# Patient Record
Sex: Male | Born: 1990
Health system: Southern US, Community
[De-identification: ages and names within clinical notes are randomized; demographics above are authoritative.]

## PROBLEM LIST (undated history)

## (undated) DIAGNOSIS — Q909 Down syndrome, unspecified: Secondary | ICD-10-CM

## (undated) DIAGNOSIS — F909 Attention-deficit hyperactivity disorder, unspecified type: Secondary | ICD-10-CM

## (undated) DIAGNOSIS — J189 Pneumonia, unspecified organism: Secondary | ICD-10-CM

## (undated) HISTORY — PX: TONSILLECTOMY: SUR1361

## (undated) HISTORY — PX: OTHER SURGICAL HISTORY: SHX169

---
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
## 2012-06-05 ENCOUNTER — Ambulatory Visit: Payer: Medicaid Other | Admitting: Audiology

## 2012-06-10 ENCOUNTER — Ambulatory Visit: Payer: Medicaid Other | Attending: Pediatrics | Admitting: Audiology

## 2012-06-10 DIAGNOSIS — H918X9 Other specified hearing loss, unspecified ear: Secondary | ICD-10-CM | POA: Insufficient documentation

## 2015-02-24 ENCOUNTER — Encounter (HOSPITAL_BASED_OUTPATIENT_CLINIC_OR_DEPARTMENT_OTHER): Payer: Self-pay | Admitting: *Deleted

## 2015-02-24 ENCOUNTER — Emergency Department (HOSPITAL_BASED_OUTPATIENT_CLINIC_OR_DEPARTMENT_OTHER)
Admission: EM | Admit: 2015-02-24 | Discharge: 2015-02-24 | Disposition: A | Discharge status: Home / Self Care | Payer: Medicaid Other | Attending: Emergency Medicine | Admitting: Emergency Medicine

## 2015-02-24 DIAGNOSIS — Q909 Down syndrome, unspecified: Secondary | ICD-10-CM | POA: Insufficient documentation

## 2015-02-24 DIAGNOSIS — L739 Follicular disorder, unspecified: Secondary | ICD-10-CM | POA: Diagnosis not present

## 2015-02-24 DIAGNOSIS — L03115 Cellulitis of right lower limb: Secondary | ICD-10-CM | POA: Insufficient documentation

## 2015-02-24 DIAGNOSIS — L02415 Cutaneous abscess of right lower limb: Secondary | ICD-10-CM | POA: Diagnosis present

## 2015-02-24 DIAGNOSIS — Z79899 Other long term (current) drug therapy: Secondary | ICD-10-CM | POA: Insufficient documentation

## 2015-02-24 DIAGNOSIS — F909 Attention-deficit hyperactivity disorder, unspecified type: Secondary | ICD-10-CM | POA: Insufficient documentation

## 2015-02-24 HISTORY — DX: Down syndrome, unspecified: Q90.9

## 2015-02-24 HISTORY — DX: Attention-deficit hyperactivity disorder, unspecified type: F90.9

## 2015-02-24 MED ORDER — DOXYCYCLINE HYCLATE 100 MG PO TABS
100.0000 mg | ORAL_TABLET | Freq: Once | ORAL | Status: AC
  Administered 2015-02-24: 100 mg via ORAL
  Filled 2015-02-24: qty 1

## 2015-02-24 MED ORDER — DOXYCYCLINE HYCLATE 100 MG PO CAPS: 100.0000 mg | ORAL_CAPSULE | Freq: Two times a day (BID) | ORAL | Status: DC

## 2015-02-24 NOTE — ED Provider Notes (Signed)
CSN: [id number]     Arrival date & time 02/24/15  2013 History  This chart was scribed for Vanetta Mulders, MD by Elon Spanner, ED Scribe. This patient was seen in room MH03/MH03 and the patient's care was started at 10:11 PM.   Chief Complaint  Patient presents with  . Abscess   The history is provided by the patient. The history is limited by a developmental delay.  LEVEL 5 CAVEAT (Developmental Delay) HPI Comments: Jeremy [name] is a 24 y.o. male who presents to the Emergency Department complaining of two separate areas of gradually worsening pain and swelling on the right inner thigh first noticed by the mother this morning.  One of the areas began to drain shortly before arrival in the ED.  The mother reports a previous history of abscess that has rarely has required I&D and once required antibiotics.  He denies fever, nausea, vomiting.     Past Medical History  Diagnosis Date  . Down's syndrome   . ADHD (attention deficit hyperactivity disorder)    Past Surgical History  Procedure Laterality Date  . Tonsillectomy     History reviewed. No pertinent family history. History  Substance Use Topics  . Smoking status: Never Smoker   . Smokeless tobacco: Not on file  . Alcohol Use: No    Review of Systems  Unable to perform ROS     Allergies  Review of patient's allergies indicates no known allergies.  Home Medications   Prior to Admission medications   Medication Sig Start Date End Date Taking? Authorizing Provider  atomoxetine (STRATTERA) 60 MG capsule Take 60 mg by mouth daily.   Yes Historical Provider, MD  methylphenidate 54 MG PO CR tablet Take 108 mg by mouth every morning.   Yes Historical Provider, MD  UNKNOWN TO PATIENT    Yes Historical Provider, MD  doxycycline (VIBRAMYCIN) 100 MG capsule Take 1 capsule (100 mg total) by mouth 2 (two) times daily. 02/24/15   Vanetta Mulders, MD   BP 108/73 mmHg  Pulse 77  Temp(Src) 97.8 F (36.6 C)  Resp 18  Wt 120 lb  (54.432 kg)  SpO2 100% Physical Exam  Constitutional: He is oriented to person, place, and time. He appears well-developed and well-nourished. No distress.  HENT:  Head: Normocephalic and atraumatic.  Mucous membranes moist.   Eyes: Conjunctivae and EOM are normal.  Sclera clear.  Pupils look normal.  Eyes track normal.   Neck: Neck supple. No tracheal deviation present.  Cardiovascular: Normal rate and regular rhythm.   No murmur heard. Pulmonary/Chest: Effort normal and breath sounds normal. No respiratory distress.  Abdominal: Bowel sounds are normal. There is no tenderness.  Musculoskeletal: Normal range of motion.  No ankle swelling.  Neurological: He is alert and oriented to person, place, and time. No cranial nerve deficit. He exhibits normal muscle tone. Coordination normal.  Skin: Skin is warm and dry.  area of redness on inner thigh measuring 10 x 15 cm.  Several pustular area on skin no more than 2-3 mm.  No large area of fluctuance or deep abscess.   Psychiatric: He has a normal mood and affect. His behavior is normal.  Nursing note and vitals reviewed.   ED Course  Procedures (including critical care time)  DIAGNOSTIC STUDIES: Oxygen Saturation is 100% on RA, normal by my interpretation.    COORDINATION OF CARE:  10:14 PM Discussed treatment plan with patient at bedside.  Patient acknowledges and agrees with plan.  Labs Review Labs Reviewed - No data to display  Imaging Review No results found.   EKG Interpretation None      MDM   Final diagnoses:  Folliculitis  Cellulitis of right lower extremity    Patient with some folliculitis. Does some small boils to both inner thighs more prominent on the right side with a low bit of cellulitis. Nothing that requires I&D being at this time. Will treat with doxycycline. Patient nontoxic no acute distress.  I personally performed the services described in this documentation, which was scribed in my presence.  The recorded information has been reviewed and is accurate.     Vanetta MuldersScott Ziyad Dyar, MD 02/24/15 2246

## 2015-02-24 NOTE — ED Notes (Signed)
2 abscesses to inner right thigh.

## 2015-02-24 NOTE — Discharge Instructions (Signed)
Take anabolic as directed. Force areas with soap and water. Return for any new or worse symptoms. Would expect things to start improving over the next couple days.

## 2015-04-13 ENCOUNTER — Encounter (HOSPITAL_COMMUNITY): Payer: Self-pay | Admitting: Emergency Medicine

## 2015-04-13 ENCOUNTER — Emergency Department (HOSPITAL_COMMUNITY)
Admission: EM | Admit: 2015-04-13 | Discharge: 2015-04-13 | Disposition: A | Discharge status: Home / Self Care | Payer: Medicaid Other | Attending: Emergency Medicine | Admitting: Emergency Medicine

## 2015-04-13 DIAGNOSIS — Z792 Long term (current) use of antibiotics: Secondary | ICD-10-CM | POA: Diagnosis not present

## 2015-04-13 DIAGNOSIS — Q909 Down syndrome, unspecified: Secondary | ICD-10-CM | POA: Diagnosis not present

## 2015-04-13 DIAGNOSIS — F909 Attention-deficit hyperactivity disorder, unspecified type: Secondary | ICD-10-CM | POA: Diagnosis not present

## 2015-04-13 DIAGNOSIS — Z79899 Other long term (current) drug therapy: Secondary | ICD-10-CM | POA: Insufficient documentation

## 2015-04-13 DIAGNOSIS — Z139 Encounter for screening, unspecified: Secondary | ICD-10-CM

## 2015-04-13 DIAGNOSIS — Z0000 Encounter for general adult medical examination without abnormal findings: Secondary | ICD-10-CM | POA: Diagnosis present

## 2015-04-13 LAB — CBC WITH DIFFERENTIAL/PLATELET
BASOS ABS: 0 10*3/uL (ref 0.0–0.1)
Basophils Relative: 1 % (ref 0–1)
EOS ABS: 0 10*3/uL (ref 0.0–0.7)
Eosinophils Relative: 0 % (ref 0–5)
HCT: 48.9 % (ref 39.0–52.0)
Hemoglobin: 17.2 g/dL — ABNORMAL HIGH (ref 13.0–17.0)
LYMPHS PCT: 46 % (ref 12–46)
Lymphs Abs: 3.1 10*3/uL (ref 0.7–4.0)
MCH: 33.7 pg (ref 26.0–34.0)
MCHC: 35.2 g/dL (ref 30.0–36.0)
MCV: 95.9 fL (ref 78.0–100.0)
Monocytes Absolute: 0.5 10*3/uL (ref 0.1–1.0)
Monocytes Relative: 7 % (ref 3–12)
Neutro Abs: 3.1 10*3/uL (ref 1.7–7.7)
Neutrophils Relative %: 46 % (ref 43–77)
PLATELETS: 233 10*3/uL (ref 150–400)
RBC: 5.1 MIL/uL (ref 4.22–5.81)
RDW: 13.4 % (ref 11.5–15.5)
WBC: 6.8 10*3/uL (ref 4.0–10.5)

## 2015-04-13 LAB — LIPID PANEL
Cholesterol: 141 mg/dL (ref 0–200)
HDL: 29 mg/dL — ABNORMAL LOW (ref >40)
LDL CALC: 59 mg/dL (ref 0–99)
Total CHOL/HDL Ratio: 4.9 RATIO
Triglycerides: 266 mg/dL — ABNORMAL HIGH (ref <150)
VLDL: 53 mg/dL — AB (ref 0–40)

## 2015-04-13 LAB — COMPREHENSIVE METABOLIC PANEL
ALBUMIN: 4.1 g/dL (ref 3.5–5.0)
ALT: 27 U/L (ref 17–63)
ANION GAP: 11 (ref 5–15)
AST: 20 U/L (ref 15–41)
Alkaline Phosphatase: 94 U/L (ref 38–126)
BILIRUBIN TOTAL: 0.6 mg/dL (ref 0.3–1.2)
BUN: 14 mg/dL (ref 6–20)
CO2: 26 mmol/L (ref 22–32)
Calcium: 9.3 mg/dL (ref 8.9–10.3)
Chloride: 102 mmol/L (ref 101–111)
Creatinine, Ser: 1.16 mg/dL (ref 0.61–1.24)
Glucose, Bld: 102 mg/dL — ABNORMAL HIGH (ref 65–99)
Potassium: 4.8 mmol/L (ref 3.5–5.1)
Sodium: 139 mmol/L (ref 135–145)
TOTAL PROTEIN: 6.8 g/dL (ref 6.5–8.1)

## 2015-04-13 LAB — TSH: TSH: 2.007 u[IU]/mL (ref 0.350–4.500)

## 2015-04-13 NOTE — ED Notes (Signed)
Pt had to be held by several staff members due to combativeness. Blood drawn successfully. Pt recovered quickly and was given Diet Pepsi. Mother instructed to follow up with [facility] for results.

## 2015-04-13 NOTE — ED Provider Notes (Signed)
CSN: [id number]     Arrival date & time 04/13/15  1101 History  This chart was scribed for non-physician practitioner, Jinny Sanders, PA-C, working with Blane Ohara, MD by Charline Bills, ED Scribe. This patient was seen in room TR09C/TR09C and the patient's care was started at 11:41 AM.   Chief Complaint  Patient presents with  . Labs Only   The history is provided by the patient. No language interpreter was used.   HPI Comments: Jeremy [name] is a 24 y.o. male, with a h/o Down's syndrome and ADHD, who presents to the Emergency Department for lab work. Pt was sent from [facility] for routine lab work. The office was unable to draw labs since the pt would not cooperate with them. No complaints reported at this time.  Past Medical History  Diagnosis Date  . Down's syndrome   . ADHD (attention deficit hyperactivity disorder)    Past Surgical History  Procedure Laterality Date  . Tonsillectomy    . Miringotomy with tubes    . Left testicular surger     No family history on file. History  Substance Use Topics  . Smoking status: Never Smoker   . Smokeless tobacco: Not on file  . Alcohol Use: No    Review of Systems  Constitutional: Negative for fever.  HENT: Negative for trouble swallowing.   Eyes: Negative for visual disturbance.  Respiratory: Negative for shortness of breath.   Cardiovascular: Negative for chest pain.  Gastrointestinal: Negative for nausea, vomiting and abdominal pain.  Genitourinary: Negative for dysuria.  Musculoskeletal: Negative for neck pain.  Skin: Negative for rash.  Neurological: Negative for dizziness, weakness and numbness.  Psychiatric/Behavioral: Negative.    Allergies  Review of patient's allergies indicates no known allergies.  Home Medications   Prior to Admission medications   Medication Sig Start Date End Date Taking? Authorizing Provider  atomoxetine (STRATTERA) 60 MG capsule Take 60 mg by mouth daily.    Historical  Provider, MD  doxycycline (VIBRAMYCIN) 100 MG capsule Take 1 capsule (100 mg total) by mouth 2 (two) times daily. 02/24/15   Vanetta Mulders, MD  methylphenidate 54 MG PO CR tablet Take 108 mg by mouth every morning.    Historical Provider, MD  UNKNOWN TO PATIENT     Historical Provider, MD   Pulse 72  Temp(Src) 98.2 F (36.8 C)  Resp 20  SpO2 100% Physical Exam  Constitutional: He is oriented to person, place, and time. He appears well-developed and well-nourished. No distress.  HENT:  Head: Normocephalic and atraumatic.  Mouth/Throat: Oropharynx is clear and moist. No oropharyngeal exudate.  Eyes: Conjunctivae and EOM are normal. Right eye exhibits no discharge. Left eye exhibits no discharge. No scleral icterus.  Neck: Normal range of motion. Neck supple. No tracheal deviation present.  Cardiovascular: Normal rate, regular rhythm and normal heart sounds.   No murmur heard. Pulmonary/Chest: Effort normal and breath sounds normal. No respiratory distress.  Abdominal: Soft. There is no tenderness.  Musculoskeletal: Normal range of motion. He exhibits no edema or tenderness.  Neurological: He is alert and oriented to person, place, and time. No cranial nerve deficit. Coordination normal.  Skin: Skin is warm and dry. No rash noted. He is not diaphoretic.  Psychiatric: He has a normal mood and affect. His behavior is normal.  Nursing note and vitals reviewed.  ED Course  Procedures (including critical care time) DIAGNOSTIC STUDIES: Oxygen Saturation is 100% on RA, normal by my interpretation.  COORDINATION OF CARE: 11:47 AM-Discussed treatment plan which includes diagnostic lab work with pt at bedside and pt agreed to plan.   Labs Review Labs Reviewed  CBC WITH DIFFERENTIAL/PLATELET - Abnormal; Notable for the following:    Hemoglobin 17.2 (*)    All other components within normal limits  COMPREHENSIVE METABOLIC PANEL - Abnormal; Notable for the following:    Glucose, Bld 102  (*)    All other components within normal limits  LIPID PANEL - Abnormal; Notable for the following:    Triglycerides 266 (*)    HDL 29 (*)    VLDL 53 (*)    All other components within normal limits  TSH   Imaging Review No results found.   EKG Interpretation None      MDM   Final diagnoses:  Encounter for medical screening examination    Patient well-appearing, afebrile, hemodynamically stable and in no acute distress. Patient only here for routine lab work, semi-PCP due to the fact that patient has some mild combativeness at baseline during routine medical procedures secondary to his Downs syndrome. Lab work was obtained without difficulty here. Screening exam performed. Patient does not have any medical complaints. Patient stable for discharge, do not believe patient requires further workup in the emergency department. Patient is mother discharged to follow up with PCP.  I personally performed the services described in this documentation, which was scribed in my presence. The recorded information has been reviewed and is accurate.  BP 116/86 mmHg  Pulse 72  Temp(Src) 98.2 F (36.8 C)  Resp 20  SpO2 100%  Signed,  Jacquette Canales, PA-C 4:57 PM    LadonaLadona Mow MowJoe Shilpa Bushee, PA-C 04/13/15 1657  Blane OharaJoshua Zavitz, MD 04/16/15 [id number]

## 2015-04-13 NOTE — Discharge Instructions (Signed)
Medical Screening Exam °A medical screening exam has been done. This exam helps find the cause of your problem and determines whether you need emergency treatment. Your exam has shown that you do not need emergency treatment at this point. It is safe for you to go to your caregiver's office or clinic for treatment. You should make an appointment today to see your caregiver as soon as he or she is available. °Depending on your illness, your symptoms and condition can change over time. If your condition gets worse or you develop new or troubling symptoms before you see your caregiver, you should return to the emergency department for further evaluation.  °Document Released: 10/18/2004 Document Revised: 12/03/2011 Document Reviewed: 05/30/2011 °ExitCare® Patient Information ©2015 [employer]. This information is not intended to replace advice given to you by your health care provider. Make sure you discuss any questions you have with your health care provider. ° °

## 2015-04-13 NOTE — ED Notes (Signed)
Pt with MR sent from [facility] for lab work. Was unable to draw in office due to lack of cooperation from pt.

## 2015-10-21 ENCOUNTER — Encounter (HOSPITAL_BASED_OUTPATIENT_CLINIC_OR_DEPARTMENT_OTHER): Payer: Self-pay | Admitting: *Deleted

## 2015-10-21 ENCOUNTER — Emergency Department (HOSPITAL_BASED_OUTPATIENT_CLINIC_OR_DEPARTMENT_OTHER)
Admission: EM | Admit: 2015-10-21 | Discharge: 2015-10-21 | Disposition: A | Discharge status: Home / Self Care | Payer: Medicaid Other | Attending: Emergency Medicine | Admitting: Emergency Medicine

## 2015-10-21 DIAGNOSIS — Z79899 Other long term (current) drug therapy: Secondary | ICD-10-CM | POA: Diagnosis not present

## 2015-10-21 DIAGNOSIS — L02415 Cutaneous abscess of right lower limb: Secondary | ICD-10-CM | POA: Insufficient documentation

## 2015-10-21 DIAGNOSIS — L0291 Cutaneous abscess, unspecified: Secondary | ICD-10-CM

## 2015-10-21 DIAGNOSIS — F909 Attention-deficit hyperactivity disorder, unspecified type: Secondary | ICD-10-CM | POA: Diagnosis not present

## 2015-10-21 DIAGNOSIS — Q909 Down syndrome, unspecified: Secondary | ICD-10-CM | POA: Insufficient documentation

## 2015-10-21 MED ORDER — LORAZEPAM 1 MG PO TABS
1.0000 mg | ORAL_TABLET | Freq: Once | ORAL | Status: AC
  Administered 2015-10-21: 1 mg via ORAL
  Filled 2015-10-21: qty 1

## 2015-10-21 NOTE — Discharge Instructions (Signed)
Please return to ED if you notice the wound becoming more red, inflamed or tender. Or if patient begins to develop fevers, sweats or other concerning signs of infection. Follow-up with your doctor in the next week for reevaluation.  Abscess An abscess is an infected area that contains a collection of pus and debris.It can occur in almost any part of the body. An abscess is also known as a furuncle or boil. CAUSES  An abscess occurs when tissue gets infected. This can occur from blockage of oil or sweat glands, infection of hair follicles, or a minor injury to the skin. As the body tries to fight the infection, pus collects in the area and creates pressure under the skin. This pressure causes pain. People with weakened immune systems have difficulty fighting infections and get certain abscesses more often.  SYMPTOMS Usually an abscess develops on the skin and becomes a painful mass that is red, warm, and tender. If the abscess forms under the skin, you may feel a moveable soft area under the skin. Some abscesses break open (rupture) on their own, but most will continue to get worse without care. The infection can spread deeper into the body and eventually into the bloodstream, causing you to feel ill.  DIAGNOSIS  Your caregiver will take your medical history and perform a physical exam. A sample of fluid may also be taken from the abscess to determine what is causing your infection. TREATMENT  Your caregiver may prescribe antibiotic medicines to fight the infection. However, taking antibiotics alone usually does not cure an abscess. Your caregiver may need to make a small cut (incision) in the abscess to drain the pus. In some cases, gauze is packed into the abscess to reduce pain and to continue draining the area. HOME CARE INSTRUCTIONS   Only take over-the-counter or prescription medicines for pain, discomfort, or fever as directed by your caregiver.  If you were prescribed antibiotics, take them  as directed. Finish them even if you start to feel better.  If gauze is used, follow your caregiver's directions for changing the gauze.  To avoid spreading the infection:  Keep your draining abscess covered with a bandage.  Wash your hands well.  Do not share personal care items, towels, or whirlpools with others.  Avoid skin contact with others.  Keep your skin and clothes clean around the abscess.  Keep all follow-up appointments as directed by your caregiver. SEEK MEDICAL CARE IF:   You have increased pain, swelling, redness, fluid drainage, or bleeding.  You have muscle aches, chills, or a general ill feeling.  You have a fever. MAKE SURE YOU:   Understand these instructions.  Will watch your condition.  Will get help right away if you are not doing well or get worse.   This information is not intended to replace advice given to you by your health care provider. Make sure you discuss any questions you have with your health care provider.   Document Released: 06/20/2005 Document Revised: 03/11/2012 Document Reviewed: 11/23/2011 Elsevier Interactive Patient Education [employer]

## 2015-10-21 NOTE — ED Notes (Signed)
Abscess to his inner right thigh.

## 2015-10-21 NOTE — ED Provider Notes (Signed)
CSN: [id number]     Arrival date & time 10/21/15  1458 History   First MD Initiated Contact with Patient 10/21/15 1537     Chief Complaint  Patient presents with  . Abscess     (Consider location/radiation/quality/duration/timing/severity/associated sxs/prior Treatment) HPI Jeremy [name] is a 25 y.o. male with history of Down syndrome, brought in by mom comes in for evaluation of possible abscess to right inner thigh. Mom reports the patient was with his father over the weekend and noticed on Monday a small red bump to the inside of patient's right thigh that is very tender to touch. They have tried intermittent warm compresses without relief. Palpation of the area worsens the discomfort. Nothing seems to make it better. Mom denies any fevers, chills, abdominal pain, nausea or vomiting, urinary symptoms, changes in bowel habits, rash. No other modifying factors  Past Medical History  Diagnosis Date  . Down's syndrome   . ADHD (attention deficit hyperactivity disorder)    Past Surgical History  Procedure Laterality Date  . Tonsillectomy    . Miringotomy with tubes    . Left testicular surger     No family history on file. Social History  Substance Use Topics  . Smoking status: Never Smoker   . Smokeless tobacco: None  . Alcohol Use: No    Review of Systems A 10 point review of systems was completed and was negative except for pertinent positives and negatives as mentioned in the history of present illness     Allergies  Review of patient's allergies indicates no known allergies.  Home Medications   Prior to Admission medications   Medication Sig Start Date End Date Taking? Authorizing Provider  CLONIDINE HCL PO Take by mouth.   Yes Historical Provider, MD  SIMVASTATIN PO Take by mouth.   Yes Historical Provider, MD  atomoxetine (STRATTERA) 60 MG capsule Take 60 mg by mouth daily.    Historical Provider, MD  doxycycline (VIBRAMYCIN) 100 MG capsule Take 1 capsule (100 mg  total) by mouth 2 (two) times daily. 02/24/15   Vanetta Mulders, MD  methylphenidate 54 MG PO CR tablet Take 108 mg by mouth every morning.    Historical Provider, MD  UNKNOWN TO PATIENT     Historical Provider, MD   BP 95/79 mmHg  Pulse 89  Temp(Src) 97.7 F (36.5 C) (Oral)  Wt 58.514 kg  SpO2 100% Physical Exam  Constitutional:  Awake, alert, nontoxic appearance.  HENT:  Head: Atraumatic.  Eyes: Right eye exhibits no discharge. Left eye exhibits no discharge.  Neck: Neck supple.  Pulmonary/Chest: Effort normal. He exhibits no tenderness.  Abdominal: Soft. There is no tenderness. There is no rebound.  Musculoskeletal: He exhibits no tenderness.  Baseline ROM, no obvious new focal weakness.  Neurological:  Mental status and motor strength appears baseline for patient and situation.  Skin: No rash noted.  Small, papular, circumferential area, approximately 1 cm in diameter, located to medial aspect of right thigh, just distal to the inguinal area.  Psychiatric: He has a normal mood and affect.  Nursing note and vitals reviewed.   ED Course  Procedures (including critical care time) Labs Review Labs Reviewed - No data to display  Imaging Review No results found. I have personally reviewed and evaluated these images and lab results as part of my medical decision-making.   EKG Interpretation None     EMERGENCY DEPARTMENT US SOFT TISSUE INTERPRETATION "Study: Limited Ultrasound of the noted body part in comments below"  INDICATIONS: Soft tissue infection Multiple views of the body part are obtained with a multi-frequency linear probe  PERFORMED BY:  Myself  IMAGES ARCHIVED?: Yes  SIDE:Right   BODY PART:Lower extremity  FINDINGS: Abcess present and Cellulitis absent  LIMITATIONS:  Body Habitus  INTERPRETATION:  Abcess present and No cellulitis noted  COMMENT:  Patient with small right inguinal abscess without any cellulitis.  Filed Vitals:   10/21/15 1504   BP: 95/79  Pulse: 89  Temp: 97.7 F (36.5 C)  TempSrc: Oral  Weight: 58.514 kg  SpO2: 100%    MDM  Jeremy [name] is a 25 y.o. male with history of Down syndrome who comes in for evaluation of abscess of his right inner thigh. Patient is accompanied by mom. Discussed physical exam findings as well as presence of abscess on ultrasound. At this time, mom prefers to wait at home to see if abscess will spontaneously drain. Wants to try warm compresses more consistently. Prefers not to have I and D done in the emergency department now. I feel this is reasonable as abscess is fairly small, well localized and there is no evidence of surrounding cellulitis. Discussed return precautions and follow up with PCP. She verbalizes understanding and agrees with plan. Patient overall looks very well, afebrile and appropriate for discharge. Final diagnoses:  Abscess        Joycie Peek, PA-C 10/21/15 1651  Rolan Bucco, MD 10/21/15 1705

## 2017-11-14 ENCOUNTER — Encounter (HOSPITAL_BASED_OUTPATIENT_CLINIC_OR_DEPARTMENT_OTHER): Payer: Self-pay | Admitting: Emergency Medicine

## 2017-11-14 ENCOUNTER — Other Ambulatory Visit: Payer: Self-pay

## 2017-11-14 ENCOUNTER — Emergency Department (HOSPITAL_BASED_OUTPATIENT_CLINIC_OR_DEPARTMENT_OTHER)
Admission: EM | Admit: 2017-11-14 | Discharge: 2017-11-14 | Disposition: A | Discharge status: Home / Self Care | Payer: Medicaid Other | Attending: Emergency Medicine | Admitting: Emergency Medicine

## 2017-11-14 DIAGNOSIS — Z79899 Other long term (current) drug therapy: Secondary | ICD-10-CM | POA: Insufficient documentation

## 2017-11-14 DIAGNOSIS — R6889 Other general symptoms and signs: Secondary | ICD-10-CM | POA: Diagnosis not present

## 2017-11-14 DIAGNOSIS — R509 Fever, unspecified: Secondary | ICD-10-CM | POA: Diagnosis present

## 2017-11-14 MED ORDER — OSELTAMIVIR PHOSPHATE 75 MG PO CAPS: 75.0000 mg | ORAL_CAPSULE | Freq: Two times a day (BID) | ORAL | 0 refills | Status: DC

## 2017-11-14 MED FILL — TAMIFLU 75 MG GELCAP: 75 | 5 days supply | Qty: 10 | Fill #0

## 2017-11-14 NOTE — ED Provider Notes (Signed)
[facility] EMERGENCY DEPARTMENT Provider Note   CSN: [id number] Arrival date & time: 11/14/17  [id number]     History   Chief Complaint Chief Complaint  Patient presents with  . Fever    HPI Lucillie GarfinkelLonnie M Kolasa is a 27 y.o. male.  HPI   Mr. Ples SpecterKingery is a 27yo male with a history of ADHD, Down syndrome who presents to the emergency department with his mother for evaluation of fever, cough, congestion and headaches. Patient states that he has a small frontal headache. Per patient's mother at bedside, he has had a measured temperature for the past two evenings with Tmax of 102.62F yesterday evening. Fever improved with tylenol and ibuprofen.  He has also had a nonproductive cough and congestion.  He began complaining of a headache yesterday evening which seemed to be improved with ibuprofen.  Patient denies neck pain/stiffness, sore throat, ear pain, body aches, shortness of breath, chest pain, abdominal pain, nausea/vomiting, dysuria.  He is otherwise drinking plenty of fluids and eating.  Past Medical History:  Diagnosis Date  . ADHD (attention deficit hyperactivity disorder)   . Down's syndrome     There are no active problems to display for this patient.   Past Surgical History:  Procedure Laterality Date  . Left testicular surger    . Miringotomy with tubes    . TONSILLECTOMY         Home Medications    Prior to Admission medications   Medication Sig Start Date End Date Taking? Authorizing Provider  atomoxetine (STRATTERA) 60 MG capsule Take 60 mg by mouth daily.    [provider]  CLONIDINE HCL PO Take by mouth.    [provider]  doxycycline (VIBRAMYCIN) 100 MG capsule Take 1 capsule (100 mg total) by mouth 2 (two) times daily. 02/24/15   Vanetta MuldersZackowski, Scott, MD  methylphenidate 54 MG PO CR tablet Take 108 mg by mouth every morning.    [provider]  SIMVASTATIN PO Take by mouth.    [provider]  UNKNOWN TO PATIENT      [provider]    Family History History reviewed. No pertinent family history.  Social History Social History   Tobacco Use  . Smoking status: Never Smoker  . Smokeless tobacco: Never Used  Substance Use Topics  . Alcohol use: No  . Drug use: No     Allergies   Patient has no known allergies.   Review of Systems Review of Systems  Constitutional: Positive for chills and fever.  HENT: Positive for congestion and rhinorrhea. Negative for sore throat.   Eyes: Negative for visual disturbance.  Respiratory: Positive for cough. Negative for shortness of breath.   Cardiovascular: Negative for chest pain.  Gastrointestinal: Negative for abdominal pain, nausea and vomiting.  Genitourinary: Negative for difficulty urinating, dysuria and frequency.  Musculoskeletal: Negative for gait problem and myalgias.  Skin: Negative for wound.  Neurological: Positive for headaches. Negative for speech difficulty.  Psychiatric/Behavioral: Negative for agitation.     Physical Exam Updated Vital Signs BP 108/85 (BP Location: Left Arm)   Pulse 90   Temp 99.1 F (37.3 C) (Oral)   Resp 20   SpO2 96%   Physical Exam  Constitutional: He is oriented to person, place, and time. He appears well-developed and well-nourished. No distress.  HENT:  Head: Normocephalic and atraumatic.  Mouth/Throat: Oropharynx is clear and moist. No oropharyngeal exudate.  Eyes: Conjunctivae are normal. Pupils are equal, round, and reactive to light.  Right eye exhibits no discharge. Left eye exhibits no discharge.  Neck: Normal range of motion. Neck supple.  Cardiovascular: Normal rate and regular rhythm. Exam reveals no friction rub.  No murmur heard. Pulmonary/Chest: Effort normal and breath sounds normal. No stridor. No respiratory distress. He has no wheezes.  Abdominal: Soft. Bowel sounds are normal. There is no tenderness. There is no guarding.  Musculoskeletal: Normal range of motion.  Moving  all extremities. Bilateral grip strength 5/5. Knee flexion/extension 5/5 bilaterally.   Lymphadenopathy:    He has no cervical adenopathy.  Neurological: He is alert and oriented to person, place, and time. Coordination normal.  Gait normal in coordination and balance.   Skin: Skin is warm and dry. Capillary refill takes less than 2 seconds. He is not diaphoretic.  Psychiatric: He has a normal mood and affect. His behavior is normal.  Nursing note and vitals reviewed.    ED Treatments / Results  Labs (all labs ordered are listed, but only abnormal results are displayed) Labs Reviewed - No data to display  EKG  EKG Interpretation None       Radiology No results found.  Procedures Procedures (including critical care time)  Medications Ordered in ED Medications - No data to display   Initial Impression / Assessment and Plan / ED Course  I have reviewed the triage vital signs and the nursing notes.  Pertinent labs & imaging results that were available during my care of the patient were reviewed by me and considered in my medical decision making (see chart for details).    Presents with his mother for cough, congestion, headache and fever.  Headache improved with ibuprofen.  He has a low-grade fever of 99.66F in the ED. No neck pain or stiffness on exam to suggest meningitis.  Lungs clear to auscultation. Mucous membranes moist, no signs of dehydration. Patient's symptoms consistent with flu versus viral URI.  Do not suspect pneumonia given cough is nonproductive and lungs clear to auscultation. Engaged in shared decision making with mother at bedside who agrees that no chest x-ray is indicated at this time. Patient is in close contact with young children in the home, and is within the 48-hour window to be treated with Tamiflu. Patient's mother at bedside elects to have her son treated.  Counseled patient's mother to continue using Tylenol as needed for fever and ibuprofen for  headache/body aches.  Discussed strict return precautions with mother who agrees and voiced understanding to the above plan.   Final Clinical Impressions(s) / ED Diagnoses   Final diagnoses:  Flu-like symptoms    ED Discharge Orders        Ordered    oseltamivir (TAMIFLU) 75 MG capsule  Every 12 hours     11/14/17 1005       Lawrence Marseilles 11/14/17 1651    Alvira Monday, MD 11/14/17 2326

## 2017-11-14 NOTE — Discharge Instructions (Signed)
I have prescribed tamiflu, please have your son take it twice a day for the next 5days.  Continue to give your son Tylenol as needed for fever and ibuprofen for headache or body aches.  He can return to school when he does not have fever for 24 hours.  Return to the emergency department if patient has neck pain or neck stiffness, headache worsens or he has any new or worsening symptoms.

## 2017-11-14 NOTE — ED Triage Notes (Signed)
Patient states that he has had a Headache x 2 days - he has had a fever and cough

## 2020-04-09 ENCOUNTER — Other Ambulatory Visit: Payer: Self-pay

## 2020-04-09 DIAGNOSIS — R7989 Other specified abnormal findings of blood chemistry: Secondary | ICD-10-CM | POA: Diagnosis present

## 2020-04-09 DIAGNOSIS — E876 Hypokalemia: Secondary | ICD-10-CM | POA: Diagnosis present

## 2020-04-09 DIAGNOSIS — U071 COVID-19: Principal | ICD-10-CM | POA: Diagnosis present

## 2020-04-09 DIAGNOSIS — J1282 Pneumonia due to coronavirus disease 2019: Secondary | ICD-10-CM | POA: Diagnosis present

## 2020-04-09 DIAGNOSIS — Z8249 Family history of ischemic heart disease and other diseases of the circulatory system: Secondary | ICD-10-CM

## 2020-04-09 DIAGNOSIS — Z20822 Contact with and (suspected) exposure to covid-19: Secondary | ICD-10-CM | POA: Diagnosis not present

## 2020-04-09 DIAGNOSIS — J9601 Acute respiratory failure with hypoxia: Secondary | ICD-10-CM | POA: Diagnosis present

## 2020-04-09 DIAGNOSIS — Q909 Down syndrome, unspecified: Secondary | ICD-10-CM

## 2020-04-09 DIAGNOSIS — Z79899 Other long term (current) drug therapy: Secondary | ICD-10-CM

## 2020-04-09 DIAGNOSIS — N179 Acute kidney failure, unspecified: Secondary | ICD-10-CM | POA: Diagnosis present

## 2020-04-09 DIAGNOSIS — F909 Attention-deficit hyperactivity disorder, unspecified type: Secondary | ICD-10-CM | POA: Diagnosis present

## 2020-04-10 ENCOUNTER — Inpatient Hospital Stay (HOSPITAL_BASED_OUTPATIENT_CLINIC_OR_DEPARTMENT_OTHER)
Admission: EM | Admit: 2020-04-10 | Discharge: 2020-04-11 | DRG: 177 | Disposition: A | Discharge status: Home / Self Care | Payer: Medicaid Other | Attending: Family Medicine | Admitting: Family Medicine

## 2020-04-10 ENCOUNTER — Encounter (HOSPITAL_BASED_OUTPATIENT_CLINIC_OR_DEPARTMENT_OTHER): Payer: Self-pay

## 2020-04-10 ENCOUNTER — Emergency Department (HOSPITAL_BASED_OUTPATIENT_CLINIC_OR_DEPARTMENT_OTHER): Payer: Medicaid Other

## 2020-04-10 DIAGNOSIS — R509 Fever, unspecified: Secondary | ICD-10-CM | POA: Diagnosis present

## 2020-04-10 DIAGNOSIS — Q909 Down syndrome, unspecified: Secondary | ICD-10-CM

## 2020-04-10 DIAGNOSIS — J069 Acute upper respiratory infection, unspecified: Secondary | ICD-10-CM | POA: Diagnosis not present

## 2020-04-10 DIAGNOSIS — E876 Hypokalemia: Secondary | ICD-10-CM

## 2020-04-10 DIAGNOSIS — U071 COVID-19: Principal | ICD-10-CM

## 2020-04-10 DIAGNOSIS — N179 Acute kidney failure, unspecified: Secondary | ICD-10-CM | POA: Diagnosis present

## 2020-04-10 DIAGNOSIS — J9601 Acute respiratory failure with hypoxia: Secondary | ICD-10-CM | POA: Diagnosis present

## 2020-04-10 DIAGNOSIS — R7989 Other specified abnormal findings of blood chemistry: Secondary | ICD-10-CM | POA: Diagnosis present

## 2020-04-10 DIAGNOSIS — Z289 Immunization not carried out for unspecified reason: Secondary | ICD-10-CM | POA: Diagnosis not present

## 2020-04-10 DIAGNOSIS — J1282 Pneumonia due to coronavirus disease 2019: Secondary | ICD-10-CM | POA: Diagnosis present

## 2020-04-10 DIAGNOSIS — N289 Disorder of kidney and ureter, unspecified: Secondary | ICD-10-CM

## 2020-04-10 DIAGNOSIS — Z20822 Contact with and (suspected) exposure to covid-19: Secondary | ICD-10-CM | POA: Diagnosis not present

## 2020-04-10 DIAGNOSIS — Z79899 Other long term (current) drug therapy: Secondary | ICD-10-CM | POA: Diagnosis not present

## 2020-04-10 DIAGNOSIS — Z8249 Family history of ischemic heart disease and other diseases of the circulatory system: Secondary | ICD-10-CM | POA: Diagnosis not present

## 2020-04-10 DIAGNOSIS — F909 Attention-deficit hyperactivity disorder, unspecified type: Secondary | ICD-10-CM | POA: Diagnosis present

## 2020-04-10 LAB — COMPREHENSIVE METABOLIC PANEL
ALT: 30 U/L (ref 0–44)
AST: 47 U/L — ABNORMAL HIGH (ref 15–41)
Albumin: 3.3 g/dL — ABNORMAL LOW (ref 3.5–5.0)
Alkaline Phosphatase: 73 U/L (ref 38–126)
Anion gap: 16 — ABNORMAL HIGH (ref 5–15)
BUN: 36 mg/dL — ABNORMAL HIGH (ref 6–20)
CO2: 20 mmol/L — ABNORMAL LOW (ref 22–32)
Calcium: 5.3 mg/dL — CL (ref 8.9–10.3)
Chloride: 101 mmol/L (ref 98–111)
Creatinine, Ser: 1.83 mg/dL — ABNORMAL HIGH (ref 0.61–1.24)
GFR calc Af Amer: 57 mL/min — ABNORMAL LOW (ref >60)
GFR calc non Af Amer: 49 mL/min — ABNORMAL LOW (ref >60)
Glucose, Bld: 163 mg/dL — ABNORMAL HIGH (ref 70–99)
Potassium: 3.3 mmol/L — ABNORMAL LOW (ref 3.5–5.1)
Sodium: 137 mmol/L (ref 135–145)
Total Bilirubin: 0.6 mg/dL (ref 0.3–1.2)
Total Protein: 6.7 g/dL (ref 6.5–8.1)

## 2020-04-10 LAB — CBC WITH DIFFERENTIAL/PLATELET
Abs Immature Granulocytes: 0.03 10*3/uL (ref 0.00–0.07)
Basophils Absolute: 0 10*3/uL (ref 0.0–0.1)
Basophils Relative: 0 %
Eosinophils Absolute: 0 10*3/uL (ref 0.0–0.5)
Eosinophils Relative: 0 %
HCT: 43.3 % (ref 39.0–52.0)
Hemoglobin: 14.8 g/dL (ref 13.0–17.0)
Immature Granulocytes: 1 %
Lymphocytes Relative: 34 %
Lymphs Abs: 1.4 10*3/uL (ref 0.7–4.0)
MCH: 33.3 pg (ref 26.0–34.0)
MCHC: 34.2 g/dL (ref 30.0–36.0)
MCV: 97.5 fL (ref 80.0–100.0)
Monocytes Absolute: 0.2 10*3/uL (ref 0.1–1.0)
Monocytes Relative: 5 %
Neutro Abs: 2.5 10*3/uL (ref 1.7–7.7)
Neutrophils Relative %: 60 %
Platelets: 157 10*3/uL (ref 150–400)
RBC: 4.44 MIL/uL (ref 4.22–5.81)
RDW: 13.8 % (ref 11.5–15.5)
WBC: 4.1 10*3/uL (ref 4.0–10.5)
nRBC: 0 % (ref 0.0–0.2)

## 2020-04-10 LAB — D-DIMER, QUANTITATIVE: D-Dimer, Quant: 2.56 ug/mL-FEU — ABNORMAL HIGH (ref 0.00–0.50)

## 2020-04-10 LAB — LACTIC ACID, PLASMA
Lactic Acid, Venous: 2.1 mmol/L (ref 0.5–1.9)
Lactic Acid, Venous: 1 mmol/L (ref 0.5–1.9)

## 2020-04-10 LAB — PROCALCITONIN: Procalcitonin: 0.23 ng/mL

## 2020-04-10 LAB — SARS CORONAVIRUS 2 BY RT PCR (HOSPITAL ORDER, PERFORMED IN ~~LOC~~ HOSPITAL LAB): SARS Coronavirus 2: POSITIVE — AB

## 2020-04-10 MED ORDER — SODIUM CHLORIDE 0.9 % IV SOLN
100.0000 mg | INTRAVENOUS | Status: AC
  Administered 2020-04-10: 100 mg via INTRAVENOUS
  Filled 2020-04-10: qty 20

## 2020-04-10 MED ORDER — SIMVASTATIN 40 MG PO TABS
40.0000 mg | ORAL_TABLET | Freq: Every evening | ORAL | Status: DC
  Administered 2020-04-10: 40 mg via ORAL
  Filled 2020-04-10: qty 1

## 2020-04-10 MED ORDER — POTASSIUM CHLORIDE 20 MEQ/15ML (10%) PO SOLN
20.0000 meq | Freq: Once | ORAL | Status: DC
  Filled 2020-04-10: qty 15

## 2020-04-10 MED ORDER — ACETAMINOPHEN 325 MG PO TABS: 650.0000 mg | ORAL_TABLET | Freq: Four times a day (QID) | ORAL | Status: DC | PRN

## 2020-04-10 MED ORDER — ENOXAPARIN SODIUM 40 MG/0.4ML ~~LOC~~ SOLN
40.0000 mg | SUBCUTANEOUS | Status: DC
  Filled 2020-04-10: qty 0.4

## 2020-04-10 MED ORDER — METHYLPHENIDATE HCL ER (OSM) 36 MG PO TBCR
108.0000 mg | EXTENDED_RELEASE_TABLET | ORAL | Status: DC
  Administered 2020-04-11: 108 mg via ORAL
  Filled 2020-04-10 (×2): qty 3

## 2020-04-10 MED ORDER — CALCIUM GLUCONATE-NACL 1-0.675 GM/50ML-% IV SOLN
1.0000 g | Freq: Once | INTRAVENOUS | Status: AC
  Administered 2020-04-10: 1000 mg via INTRAVENOUS
  Filled 2020-04-10: qty 50

## 2020-04-10 MED ORDER — ASCORBIC ACID 500 MG PO TABS
500.0000 mg | ORAL_TABLET | Freq: Every day | ORAL | Status: DC
  Administered 2020-04-10 – 2020-04-11 (×2): 500 mg via ORAL
  Filled 2020-04-10 (×2): qty 1

## 2020-04-10 MED ORDER — ACETAMINOPHEN 650 MG RE SUPP: 650.0000 mg | Freq: Four times a day (QID) | RECTAL | Status: DC | PRN

## 2020-04-10 MED ORDER — CLONIDINE HCL 0.1 MG PO TABS
0.1000 mg | ORAL_TABLET | Freq: Every evening | ORAL | Status: DC
  Filled 2020-04-10: qty 1

## 2020-04-10 MED ORDER — DEXAMETHASONE SODIUM PHOSPHATE 10 MG/ML IJ SOLN
6.0000 mg | Freq: Once | INTRAMUSCULAR | Status: AC
  Administered 2020-04-10: 6 mg via INTRAVENOUS
  Filled 2020-04-10: qty 1

## 2020-04-10 MED ORDER — DEXAMETHASONE SODIUM PHOSPHATE 10 MG/ML IJ SOLN
6.0000 mg | INTRAMUSCULAR | Status: DC
  Administered 2020-04-11: 6 mg via INTRAVENOUS
  Filled 2020-04-10: qty 1

## 2020-04-10 MED ORDER — ZINC SULFATE 220 (50 ZN) MG PO CAPS
220.0000 mg | ORAL_CAPSULE | Freq: Every day | ORAL | Status: DC
  Administered 2020-04-10 – 2020-04-11 (×2): 220 mg via ORAL
  Filled 2020-04-10 (×2): qty 1

## 2020-04-10 MED ORDER — SODIUM CHLORIDE 0.9 % IV SOLN
100.0000 mg | Freq: Every day | INTRAVENOUS | Status: DC
  Administered 2020-04-11: 100 mg via INTRAVENOUS
  Filled 2020-04-10: qty 20

## 2020-04-10 MED ORDER — ATOMOXETINE HCL 60 MG PO CAPS
60.0000 mg | ORAL_CAPSULE | Freq: Every day | ORAL | Status: DC
  Administered 2020-04-11: 60 mg via ORAL
  Filled 2020-04-10: qty 1

## 2020-04-10 MED ORDER — SODIUM CHLORIDE 0.9 % IV BOLUS
1000.0000 mL | Freq: Once | INTRAVENOUS | Status: AC
  Administered 2020-04-10: 1000 mL via INTRAVENOUS

## 2020-04-10 MED ORDER — SODIUM CHLORIDE 0.9 % IV SOLN
INTRAVENOUS | Status: AC
  Administered 2020-04-10: 100 mg via INTRAVENOUS
  Filled 2020-04-10: qty 20

## 2020-04-10 NOTE — Plan of Care (Signed)

## 2020-04-10 NOTE — ED Notes (Signed)
Timed labs and meds first

## 2020-04-10 NOTE — ED Notes (Signed)
Report provided to [facility] 

## 2020-04-10 NOTE — ED Notes (Signed)
EDP and assigned RN notified of pts Lactic acid result of 2.1.

## 2020-04-10 NOTE — ED Notes (Signed)
Date and time results received: 04/10/20 0412 (use smartphrase ".now" to insert current time)  Test: COVID swab Critical Value: Postive  Name of Provider Notified: Dr. Read Drivers  Orders Received? Or Actions Taken?: plan for admission

## 2020-04-10 NOTE — ED Notes (Signed)
Attempt to call receiving RN to provide report, unavailable at this time, will call back.

## 2020-04-10 NOTE — H&P (Signed)
History and Physical    Jeremy [name] IFO:[id number] DOB: [birth year] DOA: 04/10/2020  PCP: System, Pcp Not In  Patient coming from: Home  Chief Complaint: Cough, decreased appetite  HPI: Jeremy [name] is a 29 y.o. male with medical history significant for  Down syndrome, adhd who presents with several days of cough, fever to 103F, decreased appetite. Pt resides with mother and entire family is not vaccinated against COVID. Pt unable to provide own history given mental acuity. Per mother, who reports recent GI symptoms and is unvaccinated herself, states pt was in his usual state of health until 7/15 when above symptoms began. Pt's mother initially thought his symptoms were related to allergies. One day prior to admit, pt noted to have markedly decreased appetite, prompting ED visit to [facility].  ED Course: In the ED, pt found to be COVID pos. CXR pos for multifocal PNA. O2 sats were note to be as low as 90% on RA. Pt was started on dexamethasone and IV remdesivir. Hospitalist consulted for consideration for transfer to [facility] for continued care.  Review of Systems:  Review of Systems  Unable to perform ROS: Mental acuity    Past Medical History:  Diagnosis Date  . ADHD (attention deficit hyperactivity disorder)   . Down's syndrome     Past Surgical History:  Procedure Laterality Date  . Left testicular surger    . Miringotomy with tubes    . TONSILLECTOMY       reports that he has never smoked. He has never used smokeless tobacco. He reports that he does not drink alcohol and does not use drugs.  No Known Allergies  Family history Mother with Hypertension  Prior to Admission medications   Medication Sig Start Date End Date Taking? Authorizing Provider  atomoxetine (STRATTERA) 60 MG capsule Take 60 mg by mouth daily.   Yes [provider]  Chlorpheniramine Maleate (ALLERGY PO) Take 1 tablet by mouth in the morning and at bedtime.   Yes [provider]    cloNIDine (CATAPRES) 0.1 MG tablet Take 0.1 mg by mouth every evening.  04/02/20  Yes [provider]  guanFACINE (INTUNIV) 4 MG TB24 ER tablet Take 4 mg by mouth every morning. 02/02/20  Yes [provider]  methylphenidate 54 MG PO CR tablet Take 108 mg by mouth every morning.   Yes [provider]  mometasone (NASONEX) 50 MCG/ACT nasal spray Place 2 sprays into the nose daily as needed (allergies).  08/26/17  Yes [provider]  Olopatadine HCl (PATADAY) 0.2 % SOLN Place 1 drop into both eyes daily as needed (allergies).  12/23/18  Yes [provider]  simvastatin (ZOCOR) 40 MG tablet Take 40 mg by mouth every evening.  04/04/20  Yes [provider]  CLONIDINE HCL PO Take by mouth. Patient not taking: Reported on 04/10/2020    [provider]  doxycycline (VIBRAMYCIN) 100 MG capsule Take 1 capsule (100 mg total) by mouth 2 (two) times daily. 02/24/15   Vanetta Mulders, MD  GuanFACINE HCl 3 MG TB24 TAKE 1 TABLET(S) BY MOUTH EVERY MORNING FOR ADHD Patient not taking: Reported on 04/10/2020 04/01/16   [provider]  oseltamivir (TAMIFLU) 75 MG capsule Take 1 capsule (75 mg total) by mouth every 12 (twelve) hours. 11/14/17   Kellie Shropshire, PA-C  SIMVASTATIN PO Take by mouth. Patient not taking: Reported on 04/10/2020    [provider]  UNKNOWN TO PATIENT     [provider]  Physical Exam: Vitals:   04/10/20 1300 04/10/20 1540 04/10/20 1605 04/10/20 1648  BP:  (!) 98/56  106/66  Pulse: 70 79  68  Resp: 18 20  14   Temp:  98.3 F (36.8 C)  (!) 97.5 F (36.4 C)  TempSrc:  Oral  Oral  SpO2: 93% 94% 94% 92%  Weight:      Height:        Constitutional: NAD, calm, comfortable Vitals:   04/10/20 1300 04/10/20 1540 04/10/20 1605 04/10/20 1648  BP:  (!) 98/56  106/66  Pulse: 70 79  68  Resp: 18 20  14   Temp:  98.3 F (36.8 C)  (!) 97.5 F (36.4 C)  TempSrc:  Oral  Oral  SpO2: 93% 94% 94% 92%   Weight:      Height:       Eyes: PERRL, lids and conjunctivae normal ENMT: Mucous membranes are moist Neck: normal, supple, no masses, no thyromegaly Respiratory: clear to auscultation bilaterally, no audible wheezing, normal resp effort  Cardiovascular: perfused, no LE edema  Abdomen: no tenderness, nondistended  Musculoskeletal: no clubbing / cyanosis. No joint deformity upper and lower extremities. Good ROM, no contractures. Normal muscle tone.  Skin: no rashes, lesions, No induration Neurologic: CN 2-12 grossly intact. Sensation intact, . Strength 5/5 in all 4.  Psychiatric: affect appears bright, baseline confusion   Labs on Admission: I have personally reviewed following labs and imaging studies  CBC: Recent Labs  Lab 04/10/20 0051  WBC 4.1  NEUTROABS 2.5  HGB 14.8  HCT 43.3  MCV 97.5  PLT 157   Basic Metabolic Panel: Recent Labs  Lab 04/10/20 0051  NA 137  K 3.3*  CL 101  CO2 20*  GLUCOSE 163*  BUN 36*  CREATININE 1.83*  CALCIUM 5.3*   GFR: Estimated Creatinine Clearance: 53.3 mL/min (A) (by C-G formula based on SCr of 1.83 mg/dL (H)). Liver Function Tests: Recent Labs  Lab 04/10/20 0051  AST 47*  ALT 30  ALKPHOS 73  BILITOT 0.6  PROT 6.7  ALBUMIN 3.3*   No results for input(s): LIPASE, AMYLASE in the last 168 hours. No results for input(s): AMMONIA in the last 168 hours. Coagulation Profile: No results for input(s): INR, PROTIME in the last 168 hours. Cardiac Enzymes: No results for input(s): CKTOTAL, CKMB, CKMBINDEX, TROPONINI in the last 168 hours. BNP (last 3 results) No results for input(s): PROBNP in the last 8760 hours. HbA1C: No results for input(s): HGBA1C in the last 72 hours. CBG: No results for input(s): GLUCAP in the last 168 hours. Lipid Profile: No results for input(s): CHOL, HDL, LDLCALC, TRIG, CHOLHDL, LDLDIRECT in the last 72 hours. Thyroid Function Tests: No results for input(s): TSH, T4TOTAL, FREET4, T3FREE, THYROIDAB  in the last 72 hours. Anemia Panel: No results for input(s): VITAMINB12, FOLATE, FERRITIN, TIBC, IRON, RETICCTPCT in the last 72 hours. Urine analysis: No results found for: COLORURINE, APPEARANCEUR, LABSPEC, PHURINE, GLUCOSEU, HGBUR, BILIRUBINUR, KETONESUR, PROTEINUR, UROBILINOGEN, NITRITE, LEUKOCYTESUR Sepsis Labs: !!!!!!!!!!!!!!!!!!!!!!!!!!!!!!!!!!!!!!!!!!!! @LABRCNTIP (procalcitonin:4,lacticidven:4) ) Recent Results (from the past 240 hour(s))  SARS Coronavirus 2 by RT PCR (hospital order, performed in [facility] hospital lab) Nasopharyngeal Nasopharyngeal Swab     Status: Abnormal   Collection Time: 04/10/20  3:05 AM   Specimen: Nasopharyngeal Swab  Result Value Ref Range Status   SARS Coronavirus 2 POSITIVE (A) NEGATIVE Final    Comment: RESULT CALLED TO, READ BACK BY AND VERIFIED WITH: LUBECK,E AT 0410 ON BY CHERESNOWSKY,T (NOTE) SARS-CoV-2 target nucleic acids  are DETECTED  SARS-CoV-2 RNA is generally detectable in upper respiratory specimens  during the acute phase of infection.  Positive results are indicative  of the presence of the identified virus, but do not rule out bacterial infection or co-infection with other pathogens not detected by the test.  Clinical correlation with patient history and  other diagnostic information is necessary to determine patient infection status.  The expected result is negative.  Fact Sheet for Patients:   BoilerBrush.com.cy   Fact Sheet for Healthcare Providers:   https://pope.com/    This test is not yet approved or cleared by the [place] FDA and  has been authorized for detection and/or diagnosis of SARS-CoV-2 by FDA under an Emergency Use Authorization (EUA).  This EUA will remain in effect (meani ng this test can be used) for the duration of  the COVID-19 declaration under Section 564(b)(1) of the Act, 21 U.S.C. section 360-bbb-3(b)(1), unless the authorization  is terminated or revoked sooner.  Performed at [facility], [address] [place], [place] [address]      Radiological Exams on Admission: DG Chest [place] 1 View  Result Date: 04/10/2020 CLINICAL DATA:  29 year old male with fever. EXAM: PORTABLE CHEST 1 VIEW COMPARISON:  None. FINDINGS: Patchy areas of airspace opacity involving the right upper lobe, right mid and lower lung fields as well as faint densities in the peripheral/subpleural left lung consistent with pneumonia. Clinical correlation and follow-up recommended. There is no pleural effusion or pneumothorax. The cardiac silhouette is within limits. No acute osseous pathology. IMPRESSION: Multifocal pneumonia. Clinical correlation and follow-up recommended. Electronically Signed   By: Elgie Collard M.D.   On: 04/10/2020 01:31    CXR personally reviewed  Assessment/Plan Principal Problem:   Acute respiratory disease due to COVID-19 virus Active Problems:   Down syndrome   Hypokalemia   COVID-19 virus vaccination not done   1. Acute hypoxemic resp failure secondary to covid PNA 1. Presenting covid pos with CXR findings. At [facility], O2 sats down to low 90's on RA 2. IV dexamethethasone and remdesivir started at [facility], will continue for 10 day course and 5 day course, respectively 3. Continue antitussive as needed 4. Currently, lactic acid 1.0, pro-cal 0.23, ddimer 2.56 5. Will follow inflammatory markers 2. Down syndrome 1. Seems stable at this time 2. Cont home meds as tolerated 3. Hypokalemia 1. Potassium of 3.3 noted at time of presentation, replacement was given in ED at [facility] 2. Will repeat bmet in AM, cont to correct as needed 4. Renal failure, unknown if acute or chronic 1. Chart reviewed. Most recent Cr noted to be from 2016 of 1.16 2. Pt stable currently 3. Will repeat bmet in AM 5. Elevated ddimer 1. Will check LE dopplers 2. Currently on minimal O2 support, does not seem to be complaining of chest  pains  DVT prophylaxis: Lovenox subq  Code Status: Full Family Communication: Pt in room, family in hallway  Disposition Plan: Uncertain at this time  Consults called:  Admission status: Inpatient as would likely require greater than 2 midnight stay to treat active covid pneumonia   Rickey Barbara MD [facility] Pager On Amion  If 7PM-7AM, please contact night-coverage  04/10/2020, 5:26 PM

## 2020-04-10 NOTE — ED Provider Notes (Signed)
[facility] Provider Note: Lowella Dell, MD, FACEP  CSN: [id number] MRN: [id number] ARRIVAL: 04/09/20 at 2358 ROOM: MH02/MH02   CHIEF COMPLAINT  Fever  Level 5 caveat: Intellectually impaired HISTORY OF PRESENT ILLNESS  04/10/20 2:44 AM Jeremy [name] is a 29 y.o. male with Down syndrome.  His parents bring a man with 3 days of fever (to 103 at home, treated with acetaminophen at 9 PM yesterday evening and ibuprofen at 10 PM yesterday evening).  He has also had decreased appetite, headache and generalized weakness.  He has had nausea but no vomiting or diarrhea.  He has had an occasional cough.  He has not been vaccinated against COVID-19.   Past Medical History:  Diagnosis Date  . ADHD (attention deficit hyperactivity disorder)   . Down's syndrome     Past Surgical History:  Procedure Laterality Date  . Left testicular surger    . Miringotomy with tubes    . TONSILLECTOMY      No family history on file.  Social History   Tobacco Use  . Smoking status: Never Smoker  . Smokeless tobacco: Never Used  Substance Use Topics  . Alcohol use: No  . Drug use: No    Prior to Admission medications   Medication Sig Start Date End Date Taking? Authorizing Provider  GuanFACINE HCl 3 MG TB24 TAKE 1 TABLET(S) BY MOUTH EVERY MORNING FOR ADHD 04/01/16  Yes [provider]  mometasone (NASONEX) 50 MCG/ACT nasal spray Place into the nose. 08/26/17  Yes [provider]  Olopatadine HCl (PATADAY) 0.2 % SOLN PLACE ONE DROP INTO BOTH EYES DAILY AS NEEDED FOR ALLERGIES. 12/23/18  Yes [provider]  atomoxetine (STRATTERA) 60 MG capsule Take 60 mg by mouth daily.    [provider]  CLONIDINE HCL PO Take by mouth.    [provider]  doxycycline (VIBRAMYCIN) 100 MG capsule Take 1 capsule (100 mg total) by mouth 2 (two) times daily. 02/24/15   Vanetta Mulders, MD  methylphenidate 54 MG PO CR tablet Take 108 mg by mouth every morning.     [provider]  oseltamivir (TAMIFLU) 75 MG capsule Take 1 capsule (75 mg total) by mouth every 12 (twelve) hours. 11/14/17   Kellie Shropshire, PA-C  SIMVASTATIN PO Take by mouth.    [provider]  UNKNOWN TO PATIENT     [provider]    Allergies Patient has no known allergies.   REVIEW OF SYSTEMS     PHYSICAL EXAMINATION  Initial Vital Signs Blood pressure 100/66, pulse 93, temperature 98.6 F (37 C), temperature source Oral, resp. rate (!) 28, height 5\' 2"  (1.575 m), weight 68.5 kg, SpO2 93 %.  Examination General: Well-developed, well-nourished male in no acute distress; appearance consistent with age of record HENT: Down's facies; atraumatic Eyes: pupils equal, round and reactive to light; extraocular muscles intact Neck: supple Heart: regular rate and rhythm Lungs: clear to auscultation bilaterally Abdomen: soft; nondistended; nontender; bowel sounds present Extremities: No acute deformity; clinodactyly; single palmar creases; pulses normal Neurologic: Awake, alert; limited speech; motor function intact in all extremities and symmetric; no facial droop Skin: Warm and dry Psychiatric: Normal mood and affect   RESULTS  Summary of this visit's results, reviewed and interpreted by myself:   EKG Interpretation  Date/Time:    Ventricular Rate:    PR Interval:    QRS Duration:   QT Interval:    QTC Calculation:   R Axis:  Text Interpretation:        Laboratory Studies: Results for orders placed or performed during the hospital encounter of 04/10/20 (from the past 24 hour(s))  Lactic acid, plasma     Status: Abnormal   Collection Time: 04/10/20 12:51 AM  Result Value Ref Range   Lactic Acid, Venous 2.1 (HH) 0.5 - 1.9 mmol/L  Comprehensive metabolic panel     Status: Abnormal   Collection Time: 04/10/20 12:51 AM  Result Value Ref Range   Sodium 137 135 - 145 mmol/L   Potassium 3.3 (L) 3.5 - 5.1 mmol/L   Chloride 101 98 -  111 mmol/L   CO2 20 (L) 22 - 32 mmol/L   Glucose, Bld 163 (H) 70 - 99 mg/dL   BUN 36 (H) 6 - 20 mg/dL   Creatinine, Ser 0.03 (H) 0.61 - 1.24 mg/dL   Calcium 5.3 (LL) 8.9 - 10.3 mg/dL   Total Protein 6.7 6.5 - 8.1 g/dL   Albumin 3.3 (L) 3.5 - 5.0 g/dL   AST 47 (H) 15 - 41 U/L   ALT 30 0 - 44 U/L   Alkaline Phosphatase 73 38 - 126 U/L   Total Bilirubin 0.6 0.3 - 1.2 mg/dL   GFR calc non Af Amer 49 (L) >60 mL/min   GFR calc Af Amer 57 (L) >60 mL/min   Anion gap 16 (H) 5 - 15  CBC WITH DIFFERENTIAL     Status: None   Collection Time: 04/10/20 12:51 AM  Result Value Ref Range   WBC 4.1 4.0 - 10.5 K/uL   RBC 4.44 4.22 - 5.81 MIL/uL   Hemoglobin 14.8 13.0 - 17.0 g/dL   HCT 49.1 39 - 52 %   MCV 97.5 80.0 - 100.0 fL   MCH 33.3 26.0 - 34.0 pg   MCHC 34.2 30.0 - 36.0 g/dL   RDW 79.1 50.5 - 69.7 %   Platelets 157 150 - 400 K/uL   nRBC 0.0 0.0 - 0.2 %   Neutrophils Relative % 60 %   Neutro Abs 2.5 1.7 - 7.7 K/uL   Lymphocytes Relative 34 %   Lymphs Abs 1.4 0.7 - 4.0 K/uL   Monocytes Relative 5 %   Monocytes Absolute 0.2 0 - 1 K/uL   Eosinophils Relative 0 %   Eosinophils Absolute 0.0 0 - 0 K/uL   Basophils Relative 0 %   Basophils Absolute 0.0 0 - 0 K/uL   Immature Granulocytes 1 %   Abs Immature Granulocytes 0.03 0.00 - 0.07 K/uL  Lactic acid, plasma     Status: None   Collection Time: 04/10/20  3:05 AM  Result Value Ref Range   Lactic Acid, Venous 1.0 0.5 - 1.9 mmol/L  SARS Coronavirus 2 by RT PCR (hospital order, performed in [facility] hospital lab) Nasopharyngeal Nasopharyngeal Swab     Status: Abnormal   Collection Time: 04/10/20  3:05 AM   Specimen: Nasopharyngeal Swab  Result Value Ref Range   SARS Coronavirus 2 POSITIVE (A) NEGATIVE   Imaging Studies: DG Chest Port 1 View  Result Date: 04/10/2020 CLINICAL DATA:  29 year old male with fever. EXAM: PORTABLE CHEST 1 VIEW COMPARISON:  None. FINDINGS: Patchy areas of airspace opacity involving the right upper lobe, right  mid and lower lung fields as well as faint densities in the peripheral/subpleural left lung consistent with pneumonia. Clinical correlation and follow-up recommended. There is no pleural effusion or pneumothorax. The cardiac silhouette is within limits. No acute osseous pathology. IMPRESSION: Multifocal pneumonia.  Clinical correlation and follow-up recommended. Electronically Signed   By: Elgie Collard M.D.   On: 04/10/2020 01:31    ED COURSE and MDM  Nursing notes, initial and subsequent vitals signs, including pulse oximetry, reviewed and interpreted by myself.  Vitals:   04/10/20 0020 04/10/20 0022 04/10/20 0301  BP: 100/66  109/68  Pulse: 93  84  Resp: (!) 28    Temp: 98.6 F (37 C)    TempSrc: Oral    SpO2: 93%  93%  Weight:  68.5 kg   Height:  5\' 2"  (1.575 m)    Medications  dexamethasone (DECADRON) injection 6 mg (has no administration in time range)  sodium chloride 0.9 % bolus 1,000 mL (0 mLs Intravenous Stopped 04/10/20 0202)  calcium gluconate 1 g/ 50 mL sodium chloride IVPB (1,000 mg Intravenous New Bag/Given 04/10/20 0309)   4:12 AM Patient is positive for Covid.  His lactate has improved with IV hydration.  He was given a gram of calcium gluconate IV for his significant hypocalcemia.  It is unclear if this is a chronic condition as his most recent laboratory studies for comparison are from 2016.  Similarly it is unclear if his renal insufficiency is an acute or chronic process.  We will start remdesivir and dexamethasone.  4:34 AM Dr. 2017 accepts patient for admission to the hospitalist service at [facility].   PROCEDURES  Procedures  CRITICAL CARE Performed by: [facility] Briel Gallicchio Total critical care time: 30 minutes Critical care time was exclusive of separately billable procedures and treating other patients. Critical care was necessary to treat or prevent imminent or life-threatening deterioration. Critical care was time spent personally by me on the following  activities: development of treatment plan with patient and/or surrogate as well as nursing, discussions with consultants, evaluation of patient's response to treatment, examination of patient, obtaining history from patient or surrogate, ordering and performing treatments and interventions, ordering and review of laboratory studies, ordering and review of radiographic studies, pulse oximetry and re-evaluation of patient's condition.   ED DIAGNOSES     ICD-10-CM   1. Pneumonia due to COVID-19 virus  U07.1    J12.82   2. Hypocalcemia  E83.51   3. Hypokalemia  E87.6   4. Renal insufficiency  N28.9        Olla Delancey, Carlisle Beers, MD 04/10/20 [phone]

## 2020-04-10 NOTE — ED Triage Notes (Signed)
Pt in triage with parents. Per mother pt has been having fever, decreased appetite, HA, and generalized weakness x 2 days. TMax 103 at home. Pt last had APAP at 2100 and ibuprofen at 2200 tonight.

## 2020-04-11 LAB — COMPREHENSIVE METABOLIC PANEL
ALT: 132 U/L — ABNORMAL HIGH (ref 0–44)
AST: 208 U/L — ABNORMAL HIGH (ref 15–41)
Albumin: 2.9 g/dL — ABNORMAL LOW (ref 3.5–5.0)
Alkaline Phosphatase: 72 U/L (ref 38–126)
Anion gap: 12 (ref 5–15)
BUN: 17 mg/dL (ref 6–20)
CO2: 27 mmol/L (ref 22–32)
Calcium: 7.8 mg/dL — ABNORMAL LOW (ref 8.9–10.3)
Chloride: 103 mmol/L (ref 98–111)
Creatinine, Ser: 0.91 mg/dL (ref 0.61–1.24)
GFR calc Af Amer: >60 mL/min (ref >60)
GFR calc non Af Amer: >60 mL/min (ref >60)
Glucose, Bld: 107 mg/dL — ABNORMAL HIGH (ref 70–99)
Potassium: 4.5 mmol/L (ref 3.5–5.1)
Sodium: 142 mmol/L (ref 135–145)
Total Bilirubin: 0.3 mg/dL (ref 0.3–1.2)
Total Protein: 6.2 g/dL — ABNORMAL LOW (ref 6.5–8.1)

## 2020-04-11 LAB — D-DIMER, QUANTITATIVE: D-Dimer, Quant: 1.64 ug/mL-FEU — ABNORMAL HIGH (ref 0.00–0.50)

## 2020-04-11 LAB — HIV ANTIBODY (ROUTINE TESTING W REFLEX): HIV Screen 4th Generation wRfx: NONREACTIVE

## 2020-04-11 LAB — CBC
HCT: 44.2 % (ref 39.0–52.0)
Hemoglobin: 15.3 g/dL (ref 13.0–17.0)
MCH: 33.8 pg (ref 26.0–34.0)
MCHC: 34.6 g/dL (ref 30.0–36.0)
MCV: 97.6 fL (ref 80.0–100.0)
Platelets: 171 10*3/uL (ref 150–400)
RBC: 4.53 MIL/uL (ref 4.22–5.81)
RDW: 14.4 % (ref 11.5–15.5)
WBC: 10.8 10*3/uL — ABNORMAL HIGH (ref 4.0–10.5)
nRBC: 0 % (ref 0.0–0.2)

## 2020-04-11 LAB — ABO/RH: ABO/RH(D): A POS

## 2020-04-11 LAB — C-REACTIVE PROTEIN: CRP: 1.1 mg/dL — ABNORMAL HIGH (ref <1.0)

## 2020-04-11 LAB — FERRITIN: Ferritin: 5561 ng/mL — ABNORMAL HIGH (ref 24–336)

## 2020-04-11 MED ORDER — ACETAMINOPHEN 325 MG PO TABS: 650.0000 mg | ORAL_TABLET | Freq: Four times a day (QID) | ORAL | Status: DC | PRN

## 2020-04-11 MED ORDER — DEXAMETHASONE 6 MG PO TABS: 6.0000 mg | ORAL_TABLET | Freq: Every day | ORAL | 0 refills | Status: AC

## 2020-04-11 MED ORDER — ASCORBIC ACID 500 MG PO TABS: 500.0000 mg | ORAL_TABLET | Freq: Every day | ORAL | 0 refills | Status: DC

## 2020-04-11 NOTE — Progress Notes (Signed)
Discharge instructions provided to mother. Home care explained and proper use of pulse ox provided. Mother, Massie Maroon understanding of home care and when to seek medical treatment if needed.   Patients schedule for outpatient clinic reviewed with mother and she verbalizes understanding of plan of care.  I also provided mother with information about proper PPE use inside and outside of the hospital in order to prevent others from being exposed to Covid-19. Mother states understanding and verbalizes proper use of wearing a mask and isolation since being exposed.   Mother contacted family member for discharge transportation, patients IV's removed and all personal effects collected as stated by mother.   Patient and mother will be escorted to hospital entrance upon arrival of transportation.

## 2020-04-11 NOTE — Progress Notes (Signed)
Patient scheduled for outpatient Remdesivir infusion at 9am on Tuesday 7/20, Thursday 7/22, and Saturday 7/24 at [facility]. Please inform the patient to park at [address] [place], [place], as staff will be escorting the patient through the east entrance of the hospital.   There is a wave flag banner located near the entrance on [address]. Turn into this entrance and immediately turn left and park in 1 of the 5 designated Covid Infusion Parking spots. There is a phone number on the sign, please call and let the staff know what spot you are in and we will come out and get you. For questions call [phone].  Thanks

## 2020-04-11 NOTE — Discharge Instructions (Signed)
Patient scheduled for outpatient Remdesivir infusion at 9am on Tuesday 7/20, Thursday 7/22, and Saturday 7/24 at  Hospital. Please inform the patient to park at [address], [place], as staff will be escorting the patient through the east entrance of the hospital.   There is a wave flag banner located near the entrance on [address] Turn into this entrance and immediately turn left and park in 1 of the 5 designated Covid Infusion Parking spots. There is a phone number on the sign, please call and let the staff know what spot you are in and we will come out and get you. For questions call [phone].  Thanks   

## 2020-04-11 NOTE — Discharge Summary (Signed)
Physician Discharge Summary  Jeremy [name] YYT:[id number] DOB: [birth year] DOA: 04/10/2020  PCP: System, Pcp Not In  Admit date: 04/10/2020 Discharge date: 04/11/2020  Time spent: 40 minutes  Recommendations for Outpatient Follow-up:  1. Recommend outpatient follow-up 1 week with PCP with a telemetry visit 2. Recommend pulse ox, Decadron outpatient and will set up for remdesivir clinic 3. Will need outpatient LFTs repeated  Discharge Diagnoses:  Principal Problem:   Acute respiratory disease due to COVID-19 virus Active Problems:   Down syndrome   Hypokalemia   COVID-19 virus vaccination not done   Discharge Condition: Improved  Diet recommendation: Regular  Filed Weights   04/10/20 0022 04/10/20 0842  Weight: 68.5 kg 73.7 kg    History of present illness:  28 year old white male Down syndrome ADHD on meds Patient started developing symptoms 7/15 and thought related to allergies was on outpatient meds Emergency found to be Covid positive checks x-ray showed multifocal infiltrates started Decadron remdesivir on admission also found to have AKI on admission  Hospital Course:  Patient improved rapidly during hospital stay his creatinine trended down nicely his procalcitonin dimer and CRP also trended downwards with initiation of treatment he had some potassium replacement his creatinine got significantly better with IV fluids He had elevation of transaminases without alk phos or bilirubin increased during hospital stay that was probably related to coronavirus infection which served resolve however this will need to be monitored in the outpatient setting with labs He looked quite stable on discharge was not on oxygen and was mentating well  Consultations:  None  Discharge Exam: Vitals:   04/11/20 0516 04/11/20 1337  BP: 108/64 123/85  Pulse: 71 91  Resp: 20 18  Temp: 97.6 F (36.4 C) 98.2 F (36.8 C)  SpO2: 91% 90%    General: Awake alert coherent no distress EOMI  NCAT no focal deficit Cardiovascular: S1-S2 no murmur rub or gallop Respiratory: Clear no added sound no rales or rhonchi Abdomen soft no rebound no guarding  Discharge Instructions   Discharge Instructions    Diet - low sodium heart healthy   Complete by: As directed    Discharge instructions   Complete by: As directed    Suggest use of a pulse ox meter to check oxygen levels if you feel he is short of breath at home We will set you up with the remdesivir clinic in addition to the Covid app for tracking to make sure that Mr. Bilton is doing well We will prescribe steroids low-dose for short period of time in the outpatient setting He should be seen by his primary care physician once he is outside the 21-day window as defined by the recommended isolation time for people who were admitted to the hospital with coronavirus 19 Should he have severe shortness of breath, fever chills etc. please present to the emergency room   Increase activity slowly   Complete by: As directed      Allergies as of 04/11/2020   No Known Allergies     Medication List    STOP taking these medications   doxycycline 100 MG capsule Commonly known as: VIBRAMYCIN   oseltamivir 75 MG capsule Commonly known as: TAMIFLU   UNKNOWN TO PATIENT     TAKE these medications   acetaminophen 325 MG tablet Commonly known as: TYLENOL Take 2 tablets (650 mg total) by mouth every 6 (six) hours as needed for mild pain (or Fever >/= 101).   ALLERGY PO Take 1 tablet by mouth  in the morning and at bedtime.   ascorbic acid 500 MG tablet Commonly known as: VITAMIN C Take 1 tablet (500 mg total) by mouth daily. Start taking on: April 12, 2020   atomoxetine 60 MG capsule Commonly known as: STRATTERA Take 60 mg by mouth daily.   cloNIDine 0.1 MG tablet Commonly known as: CATAPRES Take 0.1 mg by mouth every evening. What changed: Another medication with the same name was removed. Continue taking this medication,  and follow the directions you see here.   dexamethasone 6 MG tablet Commonly known as: DECADRON Take 1 tablet (6 mg total) by mouth daily for 6 days.   guanFACINE 4 MG Tb24 ER tablet Commonly known as: INTUNIV Take 4 mg by mouth every morning. What changed: Another medication with the same name was removed. Continue taking this medication, and follow the directions you see here.   methylphenidate 54 MG CR tablet Commonly known as: CONCERTA Take 952 mg by mouth every morning.   mometasone 50 MCG/ACT nasal spray Commonly known as: NASONEX Place 2 sprays into the nose daily as needed (allergies).   Pataday 0.2 % Soln Generic drug: Olopatadine HCl Place 1 drop into both eyes daily as needed (allergies).   simvastatin 40 MG tablet Commonly known as: ZOCOR Take 40 mg by mouth every evening. What changed: Another medication with the same name was removed. Continue taking this medication, and follow the directions you see here.      No Known Allergies    The results of significant diagnostics from this hospitalization (including imaging, microbiology, ancillary and laboratory) are listed below for reference.    Significant Diagnostic Studies: DG Chest Port 1 View  Result Date: 04/10/2020 CLINICAL DATA:  29 year old male with fever. EXAM: PORTABLE CHEST 1 VIEW COMPARISON:  None. FINDINGS: Patchy areas of airspace opacity involving the right upper lobe, right mid and lower lung fields as well as faint densities in the peripheral/subpleural left lung consistent with pneumonia. Clinical correlation and follow-up recommended. There is no pleural effusion or pneumothorax. The cardiac silhouette is within limits. No acute osseous pathology. IMPRESSION: Multifocal pneumonia. Clinical correlation and follow-up recommended. Electronically Signed   By: Anner Crete M.D.   On: 04/10/2020 01:31    Microbiology: Recent Results (from the past 240 hour(s))  SARS Coronavirus 2 by RT PCR  (hospital order, performed in [facility] hospital lab) Nasopharyngeal Nasopharyngeal Swab     Status: Abnormal   Collection Time: 04/10/20  3:05 AM   Specimen: Nasopharyngeal Swab  Result Value Ref Range Status   SARS Coronavirus 2 POSITIVE (A) NEGATIVE Final    Comment: RESULT CALLED TO, READ BACK BY AND VERIFIED WITH: LUBECK,E AT 0410 ON [id number] BY CHERESNOWSKY,T (NOTE) SARS-CoV-2 target nucleic acids are DETECTED  SARS-CoV-2 RNA is generally detectable in upper respiratory specimens  during the acute phase of infection.  Positive results are indicative  of the presence of the identified virus, but do not rule out bacterial infection or co-infection with other pathogens not detected by the test.  Clinical correlation with patient history and  other diagnostic information is necessary to determine patient infection status.  The expected result is negative.  Fact Sheet for Patients:   StrictlyIdeas.no   Fact Sheet for Healthcare Providers:   BankingDealers.co.za    This test is not yet approved or cleared by the [place] FDA and  has been authorized for detection and/or diagnosis of SARS-CoV-2 by FDA under an Emergency Use Authorization (EUA).  This EUA will  remain in effect (meani ng this test can be used) for the duration of  the COVID-19 declaration under Section 564(b)(1) of the Act, 21 U.S.C. section 360-bbb-3(b)(1), unless the authorization is terminated or revoked sooner.  Performed at [facility], [address] [place], [place] [address]      Labs: Basic Metabolic Panel: Recent Labs  Lab 04/10/20 0051 04/11/20 0752  NA 137 142  K 3.3* 4.5  CL 101 103  CO2 20* 27  GLUCOSE 163* 107*  BUN 36* 17  CREATININE 1.83* 0.91  CALCIUM 5.3* 7.8*   Liver Function Tests: Recent Labs  Lab 04/10/20 0051 04/11/20 0752  AST 47* 208*  ALT 30 132*  ALKPHOS 73 72  BILITOT 0.6 0.3  PROT 6.7 6.2*  ALBUMIN  3.3* 2.9*   No results for input(s): LIPASE, AMYLASE in the last 168 hours. No results for input(s): AMMONIA in the last 168 hours. CBC: Recent Labs  Lab 04/10/20 0051 04/11/20 0752  WBC 4.1 10.8*  NEUTROABS 2.5  --   HGB 14.8 15.3  HCT 43.3 44.2  MCV 97.5 97.6  PLT 157 171   Cardiac Enzymes: No results for input(s): CKTOTAL, CKMB, CKMBINDEX, TROPONINI in the last 168 hours. BNP: BNP (last 3 results) No results for input(s): BNP in the last 8760 hours.  ProBNP (last 3 results) No results for input(s): PROBNP in the last 8760 hours.  CBG: No results for input(s): GLUCAP in the last 168 hours.     Signed:  Nita Sells MD   [facility] 04/11/2020, 2:04 PM

## 2020-04-12 ENCOUNTER — Ambulatory Visit (HOSPITAL_COMMUNITY)
Admission: RE | Admit: 2020-04-12 | Discharge: 2020-04-12 | Disposition: A | Discharge status: Home / Self Care | Payer: Medicaid Other | Source: Ambulatory Visit | Attending: Pulmonary Disease | Admitting: Pulmonary Disease

## 2020-04-12 DIAGNOSIS — U071 COVID-19: Secondary | ICD-10-CM | POA: Diagnosis not present

## 2020-04-12 MED ORDER — FAMOTIDINE IN NACL 20-0.9 MG/50ML-% IV SOLN: 20.0000 mg | Freq: Once | INTRAVENOUS | Status: DC | PRN

## 2020-04-12 MED ORDER — ALBUTEROL SULFATE HFA 108 (90 BASE) MCG/ACT IN AERS: 2.0000 | INHALATION_SPRAY | Freq: Once | RESPIRATORY_TRACT | Status: DC | PRN

## 2020-04-12 MED ORDER — METHYLPREDNISOLONE SODIUM SUCC 125 MG IJ SOLR: 125.0000 mg | Freq: Once | INTRAMUSCULAR | Status: DC | PRN

## 2020-04-12 MED ORDER — SODIUM CHLORIDE 0.9 % IV SOLN
100.0000 mg | Freq: Once | INTRAVENOUS | Status: DC
  Administered 2020-04-12: 100 mg via INTRAVENOUS
  Filled 2020-04-12: qty 20

## 2020-04-12 MED ORDER — SODIUM CHLORIDE 0.9 % IV SOLN: INTRAVENOUS | Status: DC | PRN

## 2020-04-12 MED ORDER — DIPHENHYDRAMINE HCL 50 MG/ML IJ SOLN: 50.0000 mg | Freq: Once | INTRAMUSCULAR | Status: DC | PRN

## 2020-04-12 MED ORDER — EPINEPHRINE 0.3 MG/0.3ML IJ SOAJ: 0.3000 mg | Freq: Once | INTRAMUSCULAR | Status: DC | PRN

## 2020-04-12 NOTE — Progress Notes (Signed)
Pts mother called back stating the pharmacy did not have prescriptions. Prescriptions called into [facility] [phone]. Drinda Butts patients mother called with update.

## 2020-04-12 NOTE — Discharge Instructions (Signed)
10 Things You Can Do to Manage Your COVID-19 Symptoms at Home If you have possible or confirmed COVID-19: 1. Stay home from work and school. And stay away from other public places. If you must go out, avoid using any kind of public transportation, ridesharing, or taxis. 2. Monitor your symptoms carefully. If your symptoms get worse, call your healthcare provider immediately. 3. Get rest and stay hydrated. 4. If you have a medical appointment, call the healthcare provider ahead of time and tell them that you have or may have COVID-19. 5. For medical emergencies, call 911 and notify the dispatch personnel that you have or may have COVID-19. 6. Cover your cough and sneezes with a tissue or use the inside of your elbow. 7. Wash your hands often with soap and water for at least 20 seconds or clean your hands with an alcohol-based hand sanitizer that contains at least 60% alcohol. 8. As much as possible, stay in a specific room and away from other people in your home. Also, you should use a separate bathroom, if available. If you need to be around other people in or outside of the home, wear a mask. 9. Avoid sharing personal items with other people in your household, like dishes, towels, and bedding. 10. Clean all surfaces that are touched often, like counters, tabletops, and doorknobs. Use household cleaning sprays or wipes according to the label instructions. cdc.gov/coronavirus 03/25/2019 This information is not intended to replace advice given to you by your health care provider. Make sure you discuss any questions you have with your health care provider. Document Revised: 08/27/2019 Document Reviewed: 08/27/2019 Elsevier Patient Education  2020 Elsevier Inc.  

## 2020-04-12 NOTE — Progress Notes (Signed)
  Diagnosis: COVID-19  Physician: Dr. Shan Levans  Procedure: Covid Infusion Clinic Med: remdesivir infusion - Provided patient with remdesivir fact sheet for patients, parents and caregivers prior to infusion.  Complications: No immediate complications noted.  Discharge: Discharged home   Essie Hart 04/12/2020

## 2020-04-14 ENCOUNTER — Ambulatory Visit (HOSPITAL_COMMUNITY)
Admit: 2020-04-14 | Discharge: 2020-04-14 | Disposition: A | Discharge status: Home / Self Care | Payer: Medicaid Other | Attending: Pulmonary Disease | Admitting: Pulmonary Disease

## 2020-04-14 DIAGNOSIS — J1282 Pneumonia due to coronavirus disease 2019: Secondary | ICD-10-CM | POA: Insufficient documentation

## 2020-04-14 DIAGNOSIS — U071 COVID-19: Secondary | ICD-10-CM | POA: Insufficient documentation

## 2020-04-14 MED ORDER — ALBUTEROL SULFATE HFA 108 (90 BASE) MCG/ACT IN AERS: 2.0000 | INHALATION_SPRAY | Freq: Once | RESPIRATORY_TRACT | Status: DC | PRN

## 2020-04-14 MED ORDER — METHYLPREDNISOLONE SODIUM SUCC 125 MG IJ SOLR: 125.0000 mg | Freq: Once | INTRAMUSCULAR | Status: DC | PRN

## 2020-04-14 MED ORDER — EPINEPHRINE 0.3 MG/0.3ML IJ SOAJ: 0.3000 mg | Freq: Once | INTRAMUSCULAR | Status: DC | PRN

## 2020-04-14 MED ORDER — SODIUM CHLORIDE 0.9 % IV SOLN: INTRAVENOUS | Status: DC | PRN

## 2020-04-14 MED ORDER — DIPHENHYDRAMINE HCL 50 MG/ML IJ SOLN: 50.0000 mg | Freq: Once | INTRAMUSCULAR | Status: DC | PRN

## 2020-04-14 MED ORDER — SODIUM CHLORIDE 0.9 % IV SOLN
100.0000 mg | Freq: Once | INTRAVENOUS | Status: AC
  Administered 2020-04-14: 100 mg via INTRAVENOUS
  Filled 2020-04-14: qty 20

## 2020-04-14 MED ORDER — FAMOTIDINE IN NACL 20-0.9 MG/50ML-% IV SOLN: 20.0000 mg | Freq: Once | INTRAVENOUS | Status: DC | PRN

## 2020-04-14 NOTE — Progress Notes (Signed)
  Diagnosis: COVID-19  Physician:Dr wright   Procedure: Covid Infusion Clinic Med: remdesivir infusion - Provided patient with remdesivir fact sheet for patients, parents and caregivers prior to infusion.  Complications: No immediate complications noted.  Discharge: Discharged home   Jeremy [name] 04/14/2020

## 2020-04-15 LAB — CULTURE, BLOOD (ROUTINE X 2)
Culture: NO GROWTH
Culture: NO GROWTH
Special Requests: ADEQUATE
Special Requests: ADEQUATE

## 2020-04-16 ENCOUNTER — Ambulatory Visit (HOSPITAL_COMMUNITY)
Admit: 2020-04-16 | Discharge: 2020-04-16 | Disposition: A | Discharge status: Home / Self Care | Payer: Medicaid Other | Attending: Pulmonary Disease | Admitting: Pulmonary Disease

## 2020-04-16 DIAGNOSIS — U071 COVID-19: Secondary | ICD-10-CM | POA: Diagnosis not present

## 2020-04-16 DIAGNOSIS — J1282 Pneumonia due to coronavirus disease 2019: Secondary | ICD-10-CM | POA: Insufficient documentation

## 2020-04-16 MED ORDER — DIPHENHYDRAMINE HCL 50 MG/ML IJ SOLN: 50.0000 mg | Freq: Once | INTRAMUSCULAR | Status: DC | PRN

## 2020-04-16 MED ORDER — ALBUTEROL SULFATE HFA 108 (90 BASE) MCG/ACT IN AERS: 2.0000 | INHALATION_SPRAY | Freq: Once | RESPIRATORY_TRACT | Status: DC | PRN

## 2020-04-16 MED ORDER — EPINEPHRINE 0.3 MG/0.3ML IJ SOAJ: 0.3000 mg | Freq: Once | INTRAMUSCULAR | Status: DC | PRN

## 2020-04-16 MED ORDER — SODIUM CHLORIDE 0.9 % IV SOLN
100.0000 mg | Freq: Once | INTRAVENOUS | Status: AC
  Administered 2020-04-16: 100 mg via INTRAVENOUS
  Filled 2020-04-16: qty 20

## 2020-04-16 MED ORDER — METHYLPREDNISOLONE SODIUM SUCC 125 MG IJ SOLR: 125.0000 mg | Freq: Once | INTRAMUSCULAR | Status: DC | PRN

## 2020-04-16 MED ORDER — FAMOTIDINE IN NACL 20-0.9 MG/50ML-% IV SOLN: 20.0000 mg | Freq: Once | INTRAVENOUS | Status: DC | PRN

## 2020-04-16 MED ORDER — SODIUM CHLORIDE 0.9 % IV SOLN: INTRAVENOUS | Status: DC | PRN

## 2020-04-16 NOTE — Progress Notes (Signed)
  Diagnosis: COVID-19  Physician:Dr Wright  Procedure: Covid Infusion Clinic Med: remdesivir infusion - Provided patient with remdesivir fact sheet for patients, parents and caregivers prior to infusion.  Complications: No immediate complications noted.  Discharge: Discharged home   Jeremy [name] 04/16/2020   

## 2020-04-16 NOTE — Discharge Instructions (Signed)
10 Things You Can Do to Manage Your COVID-19 Symptoms at Home If you have possible or confirmed COVID-19: 1. Stay home from work and school. And stay away from other public places. If you must go out, avoid using any kind of public transportation, ridesharing, or taxis. 2. Monitor your symptoms carefully. If your symptoms get worse, call your healthcare provider immediately. 3. Get rest and stay hydrated. 4. If you have a medical appointment, call the healthcare provider ahead of time and tell them that you have or may have COVID-19. 5. For medical emergencies, call 911 and notify the dispatch personnel that you have or may have COVID-19. 6. Cover your cough and sneezes with a tissue or use the inside of your elbow. 7. Wash your hands often with soap and water for at least 20 seconds or clean your hands with an alcohol-based hand sanitizer that contains at least 60% alcohol. 8. As much as possible, stay in a specific room and away from other people in your home. Also, you should use a separate bathroom, if available. If you need to be around other people in or outside of the home, wear a mask. 9. Avoid sharing personal items with other people in your household, like dishes, towels, and bedding. 10. Clean all surfaces that are touched often, like counters, tabletops, and doorknobs. Use household cleaning sprays or wipes according to the label instructions. cdc.gov/coronavirus 03/25/2019 This information is not intended to replace advice given to you by your health care provider. Make sure you discuss any questions you have with your health care provider. Document Revised: 08/27/2019 Document Reviewed: 08/27/2019 Elsevier Patient Education  2020 Elsevier Inc.  

## 2020-05-02 NOTE — Addendum Note (Signed)
Encounter addended by: Essie Hart, RN on: 05/02/2020 5:22 PM  Actions taken: Charge Capture section accepted

## 2020-05-02 NOTE — Addendum Note (Signed)
Encounter addended by: Essie Hart, RN on: 05/02/2020 5:21 PM  Actions taken: Charge Capture section accepted

## 2020-05-03 NOTE — Addendum Note (Signed)
Encounter addended by: Essie Hart, RN on: 05/03/2020 9:52 AM  Actions taken: LDA properties accepted, MAR administration edited, MAR administration accepted

## 2020-12-30 IMAGING — DX DG CHEST 1V PORT
1 series · 1 of 1 positions shown · non-contrast
Comparison: None.

CLINICAL DATA: 29-year-old male with fever.

EXAM:
PORTABLE CHEST 1 VIEW

[chest ap]
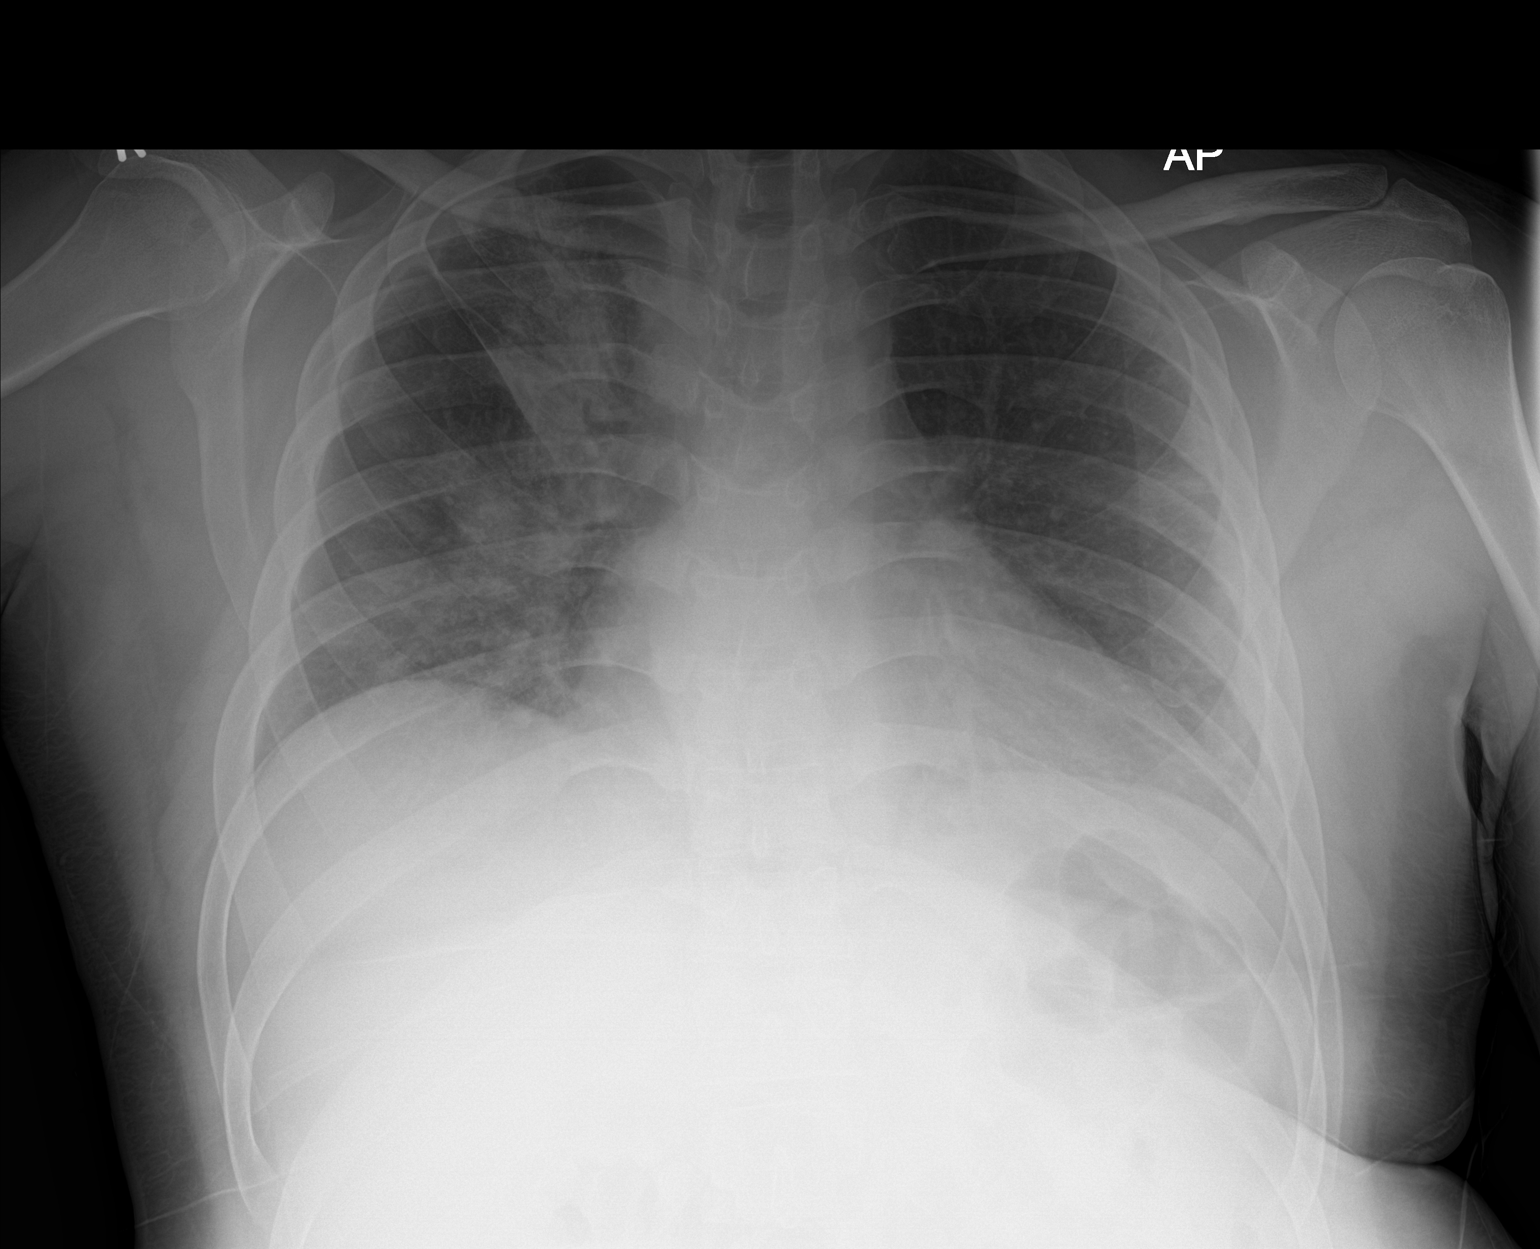

[1 of 1 positions shown; findings below may reference images not displayed]

FINDINGS: Patchy areas of airspace opacity involving the right upper lobe,
right mid and lower lung fields as well as faint densities in the
peripheral/subpleural left lung consistent with pneumonia. Clinical
correlation and follow-up recommended. There is no pleural effusion
or pneumothorax. The cardiac silhouette is within limits. No acute
osseous pathology.
IMPRESSION: Multifocal pneumonia. Clinical correlation and follow-up
recommended.

## 2022-12-21 ENCOUNTER — Emergency Department (HOSPITAL_COMMUNITY): Payer: Medicaid Other

## 2022-12-21 ENCOUNTER — Emergency Department (HOSPITAL_BASED_OUTPATIENT_CLINIC_OR_DEPARTMENT_OTHER): Payer: Medicaid Other

## 2022-12-21 ENCOUNTER — Inpatient Hospital Stay (HOSPITAL_COMMUNITY)
Admission: EM | Admit: 2022-12-21 | Discharge: 2023-01-23 | DRG: 163 | Disposition: E | Payer: Medicaid Other | Attending: Critical Care Medicine | Admitting: Critical Care Medicine

## 2022-12-21 ENCOUNTER — Other Ambulatory Visit: Payer: Self-pay

## 2022-12-21 ENCOUNTER — Encounter (HOSPITAL_COMMUNITY): Payer: Self-pay

## 2022-12-21 DIAGNOSIS — Z6838 Body mass index (BMI) 38.0-38.9, adult: Secondary | ICD-10-CM | POA: Diagnosis not present

## 2022-12-21 DIAGNOSIS — I80202 Phlebitis and thrombophlebitis of unspecified deep vessels of left lower extremity: Secondary | ICD-10-CM | POA: Diagnosis not present

## 2022-12-21 DIAGNOSIS — I2609 Other pulmonary embolism with acute cor pulmonale: Secondary | ICD-10-CM | POA: Diagnosis present

## 2022-12-21 DIAGNOSIS — R0989 Other specified symptoms and signs involving the circulatory and respiratory systems: Secondary | ICD-10-CM | POA: Diagnosis not present

## 2022-12-21 DIAGNOSIS — R52 Pain, unspecified: Secondary | ICD-10-CM

## 2022-12-21 DIAGNOSIS — I82812 Embolism and thrombosis of superficial veins of left lower extremities: Secondary | ICD-10-CM | POA: Diagnosis present

## 2022-12-21 DIAGNOSIS — I82402 Acute embolism and thrombosis of unspecified deep veins of left lower extremity: Secondary | ICD-10-CM | POA: Diagnosis not present

## 2022-12-21 DIAGNOSIS — D62 Acute posthemorrhagic anemia: Secondary | ICD-10-CM | POA: Diagnosis not present

## 2022-12-21 DIAGNOSIS — F909 Attention-deficit hyperactivity disorder, unspecified type: Secondary | ICD-10-CM | POA: Diagnosis present

## 2022-12-21 DIAGNOSIS — Z79899 Other long term (current) drug therapy: Secondary | ICD-10-CM | POA: Diagnosis not present

## 2022-12-21 DIAGNOSIS — I82442 Acute embolism and thrombosis of left tibial vein: Secondary | ICD-10-CM | POA: Diagnosis present

## 2022-12-21 DIAGNOSIS — I82452 Acute embolism and thrombosis of left peroneal vein: Secondary | ICD-10-CM | POA: Diagnosis present

## 2022-12-21 DIAGNOSIS — I82412 Acute embolism and thrombosis of left femoral vein: Secondary | ICD-10-CM | POA: Diagnosis present

## 2022-12-21 DIAGNOSIS — Z9989 Dependence on other enabling machines and devices: Secondary | ICD-10-CM | POA: Diagnosis not present

## 2022-12-21 DIAGNOSIS — I2699 Other pulmonary embolism without acute cor pulmonale: Secondary | ICD-10-CM | POA: Diagnosis present

## 2022-12-21 DIAGNOSIS — Z8616 Personal history of COVID-19: Secondary | ICD-10-CM

## 2022-12-21 DIAGNOSIS — E669 Obesity, unspecified: Secondary | ICD-10-CM | POA: Diagnosis present

## 2022-12-21 DIAGNOSIS — R000 Tachycardia, unspecified: Secondary | ICD-10-CM | POA: Diagnosis present

## 2022-12-21 DIAGNOSIS — D72829 Elevated white blood cell count, unspecified: Secondary | ICD-10-CM | POA: Diagnosis present

## 2022-12-21 DIAGNOSIS — I50811 Acute right heart failure: Secondary | ICD-10-CM | POA: Diagnosis not present

## 2022-12-21 DIAGNOSIS — R0902 Hypoxemia: Secondary | ICD-10-CM

## 2022-12-21 DIAGNOSIS — Q909 Down syndrome, unspecified: Secondary | ICD-10-CM | POA: Diagnosis not present

## 2022-12-21 DIAGNOSIS — I82432 Acute embolism and thrombosis of left popliteal vein: Secondary | ICD-10-CM | POA: Diagnosis present

## 2022-12-21 DIAGNOSIS — N179 Acute kidney failure, unspecified: Secondary | ICD-10-CM | POA: Diagnosis present

## 2022-12-21 DIAGNOSIS — R451 Restlessness and agitation: Secondary | ICD-10-CM | POA: Diagnosis not present

## 2022-12-21 DIAGNOSIS — R079 Chest pain, unspecified: Secondary | ICD-10-CM | POA: Diagnosis not present

## 2022-12-21 DIAGNOSIS — M7989 Other specified soft tissue disorders: Secondary | ICD-10-CM | POA: Diagnosis not present

## 2022-12-21 DIAGNOSIS — J9601 Acute respiratory failure with hypoxia: Secondary | ICD-10-CM | POA: Diagnosis present

## 2022-12-21 DIAGNOSIS — R739 Hyperglycemia, unspecified: Secondary | ICD-10-CM | POA: Diagnosis present

## 2022-12-21 DIAGNOSIS — N182 Chronic kidney disease, stage 2 (mild): Secondary | ICD-10-CM | POA: Diagnosis present

## 2022-12-21 DIAGNOSIS — I2782 Chronic pulmonary embolism: Secondary | ICD-10-CM | POA: Diagnosis not present

## 2022-12-21 DIAGNOSIS — G4733 Obstructive sleep apnea (adult) (pediatric): Secondary | ICD-10-CM | POA: Diagnosis present

## 2022-12-21 DIAGNOSIS — I469 Cardiac arrest, cause unspecified: Secondary | ICD-10-CM | POA: Diagnosis not present

## 2022-12-21 DIAGNOSIS — I998 Other disorder of circulatory system: Secondary | ICD-10-CM | POA: Diagnosis present

## 2022-12-21 DIAGNOSIS — R001 Bradycardia, unspecified: Secondary | ICD-10-CM | POA: Diagnosis not present

## 2022-12-21 DIAGNOSIS — I82492 Acute embolism and thrombosis of other specified deep vein of left lower extremity: Secondary | ICD-10-CM | POA: Diagnosis not present

## 2022-12-21 DIAGNOSIS — R570 Cardiogenic shock: Secondary | ICD-10-CM | POA: Diagnosis not present

## 2022-12-21 HISTORY — DX: Pneumonia, unspecified organism: J18.9

## 2022-12-21 LAB — COMPREHENSIVE METABOLIC PANEL
ALT: 22 U/L (ref 0–44)
AST: 17 U/L (ref 15–41)
Albumin: 3.7 g/dL (ref 3.5–5.0)
Alkaline Phosphatase: 160 U/L — ABNORMAL HIGH (ref 38–126)
Anion gap: 15 (ref 5–15)
BUN: 14 mg/dL (ref 6–20)
CO2: 23 mmol/L (ref 22–32)
Calcium: 9.2 mg/dL (ref 8.9–10.3)
Chloride: 99 mmol/L (ref 98–111)
Creatinine, Ser: 1.46 mg/dL — ABNORMAL HIGH (ref 0.61–1.24)
GFR, Estimated: >60 mL/min (ref >60)
Glucose, Bld: 150 mg/dL — ABNORMAL HIGH (ref 70–99)
Potassium: 4.9 mmol/L (ref 3.5–5.1)
Sodium: 137 mmol/L (ref 135–145)
Total Bilirubin: 1 mg/dL (ref 0.3–1.2)
Total Protein: 8.7 g/dL — ABNORMAL HIGH (ref 6.5–8.1)

## 2022-12-21 LAB — CBC WITH DIFFERENTIAL/PLATELET
Abs Immature Granulocytes: 0.1 10*3/uL — ABNORMAL HIGH (ref 0.00–0.07)
Basophils Absolute: 0.1 10*3/uL (ref 0.0–0.1)
Basophils Relative: 0 %
Eosinophils Absolute: 0 10*3/uL (ref 0.0–0.5)
Eosinophils Relative: 0 %
HCT: 52.5 % — ABNORMAL HIGH (ref 39.0–52.0)
Hemoglobin: 18.3 g/dL — ABNORMAL HIGH (ref 13.0–17.0)
Immature Granulocytes: 1 %
Lymphocytes Relative: 25 %
Lymphs Abs: 3 10*3/uL (ref 0.7–4.0)
MCH: 34 pg (ref 26.0–34.0)
MCHC: 34.9 g/dL (ref 30.0–36.0)
MCV: 97.4 fL (ref 80.0–100.0)
Monocytes Absolute: 0.7 10*3/uL (ref 0.1–1.0)
Monocytes Relative: 6 %
Neutro Abs: 8.2 10*3/uL — ABNORMAL HIGH (ref 1.7–7.7)
Neutrophils Relative %: 68 %
Platelets: 243 10*3/uL (ref 150–400)
RBC: 5.39 MIL/uL (ref 4.22–5.81)
RDW: 13.8 % (ref 11.5–15.5)
WBC: 12.1 10*3/uL — ABNORMAL HIGH (ref 4.0–10.5)
nRBC: 0 % (ref 0.0–0.2)

## 2022-12-21 LAB — I-STAT VENOUS BLOOD GAS, ED
Acid-Base Excess: 0 mmol/L (ref 0.0–2.0)
Bicarbonate: 25.8 mmol/L (ref 20.0–28.0)
Calcium, Ion: 1.13 mmol/L — ABNORMAL LOW (ref 1.15–1.40)
HCT: 54 % — ABNORMAL HIGH (ref 39.0–52.0)
Hemoglobin: 18.4 g/dL — ABNORMAL HIGH (ref 13.0–17.0)
O2 Saturation: 66 %
Potassium: 5 mmol/L (ref 3.5–5.1)
Sodium: 138 mmol/L (ref 135–145)
TCO2: 27 mmol/L (ref 22–32)
pCO2, Ven: 46.8 mmHg (ref 44–60)
pH, Ven: 7.35 (ref 7.25–7.43)
pO2, Ven: 36 mmHg (ref 32–45)

## 2022-12-21 LAB — TROPONIN I (HIGH SENSITIVITY)
Troponin I (High Sensitivity): 40 ng/L — ABNORMAL HIGH (ref <18)
Troponin I (High Sensitivity): 25 ng/L — ABNORMAL HIGH (ref <18)

## 2022-12-21 LAB — HEPARIN LEVEL (UNFRACTIONATED): Heparin Unfractionated: 0.19 IU/mL — ABNORMAL LOW (ref 0.30–0.70)

## 2022-12-21 LAB — LACTIC ACID, PLASMA: Lactic Acid, Venous: 1.7 mmol/L (ref 0.5–1.9)

## 2022-12-21 LAB — I-STAT CHEM 8, ED
BUN: 15 mg/dL (ref 6–20)
Calcium, Ion: 0.95 mmol/L — ABNORMAL LOW (ref 1.15–1.40)
Chloride: 102 mmol/L (ref 98–111)
Creatinine, Ser: 1.4 mg/dL — ABNORMAL HIGH (ref 0.61–1.24)
Glucose, Bld: 148 mg/dL — ABNORMAL HIGH (ref 70–99)
HCT: 55 % — ABNORMAL HIGH (ref 39.0–52.0)
Hemoglobin: 18.7 g/dL — ABNORMAL HIGH (ref 13.0–17.0)
Potassium: 5.1 mmol/L (ref 3.5–5.1)
Sodium: 138 mmol/L (ref 135–145)
TCO2: 25 mmol/L (ref 22–32)

## 2022-12-21 LAB — BRAIN NATRIURETIC PEPTIDE: B Natriuretic Peptide: 137.4 pg/mL — ABNORMAL HIGH (ref 0.0–100.0)

## 2022-12-21 MED ORDER — INSULIN ASPART 100 UNIT/ML IJ SOLN: 0.0000 [IU] | Freq: Every day | INTRAMUSCULAR | Status: DC

## 2022-12-21 MED ORDER — MIDAZOLAM HCL (PF) 5 MG/ML IJ SOLN: 2.0000 mg | Freq: Once | INTRAMUSCULAR | Status: DC

## 2022-12-21 MED ORDER — ATOMOXETINE HCL 60 MG PO CAPS
60.0000 mg | ORAL_CAPSULE | Freq: Every day | ORAL | Status: DC
  Administered 2022-12-22 – 2022-12-23 (×2): 60 mg via ORAL
  Filled 2022-12-21 (×4): qty 1

## 2022-12-21 MED ORDER — METHYLPHENIDATE HCL ER 27 MG PO TB24
108.0000 mg | ORAL_TABLET | Freq: Every day | ORAL | Status: DC
  Administered 2022-12-22 – 2022-12-23 (×2): 108 mg via ORAL
  Filled 2022-12-21 (×2): qty 4

## 2022-12-21 MED ORDER — MORPHINE SULFATE (PF) 4 MG/ML IV SOLN
4.0000 mg | Freq: Once | INTRAVENOUS | Status: AC
  Administered 2022-12-21: 4 mg via INTRAVENOUS
  Filled 2022-12-21: qty 1

## 2022-12-21 MED ORDER — HEPARIN (PORCINE) 25000 UT/250ML-% IV SOLN
1900.0000 [IU]/h | INTRAVENOUS | Status: DC
  Administered 2022-12-21: 1200 [IU]/h via INTRAVENOUS
  Administered 2022-12-22: 1350 [IU]/h via INTRAVENOUS
  Administered 2022-12-22: 1650 [IU]/h via INTRAVENOUS
  Administered 2022-12-23: 1700 [IU]/h via INTRAVENOUS
  Administered 2022-12-23: 1650 [IU]/h via INTRAVENOUS
  Administered 2022-12-24: 1900 [IU]/h via INTRAVENOUS
  Filled 2022-12-21 (×6): qty 250

## 2022-12-21 MED ORDER — OLOPATADINE HCL 0.1 % OP SOLN: 1.0000 [drp] | Freq: Two times a day (BID) | OPHTHALMIC | Status: DC | PRN

## 2022-12-21 MED ORDER — POLYETHYLENE GLYCOL 3350 17 G PO PACK
17.0000 g | PACK | Freq: Every day | ORAL | Status: DC | PRN
  Administered 2022-12-23: 17 g via ORAL
  Filled 2022-12-21: qty 1

## 2022-12-21 MED ORDER — INSULIN ASPART 100 UNIT/ML IJ SOLN: 0.0000 [IU] | Freq: Three times a day (TID) | INTRAMUSCULAR | Status: DC

## 2022-12-21 MED ORDER — HEPARIN BOLUS VIA INFUSION
2000.0000 [IU] | Freq: Once | INTRAVENOUS | Status: AC
  Administered 2022-12-21: 2000 [IU] via INTRAVENOUS
  Filled 2022-12-21: qty 2000

## 2022-12-21 MED ORDER — IOHEXOL 350 MG/ML SOLN
75.0000 mL | Freq: Once | INTRAVENOUS | Status: AC | PRN
  Administered 2022-12-21: 75 mL via INTRAVENOUS

## 2022-12-21 MED ORDER — MIDAZOLAM HCL 2 MG/2ML IJ SOLN
2.0000 mg | Freq: Once | INTRAMUSCULAR | Status: AC
  Administered 2022-12-21: 2 mg via INTRAVENOUS
  Filled 2022-12-21: qty 2

## 2022-12-21 MED ORDER — HEPARIN BOLUS VIA INFUSION
3500.0000 [IU] | Freq: Once | INTRAVENOUS | Status: AC
  Administered 2022-12-21: 3500 [IU] via INTRAVENOUS
  Filled 2022-12-21: qty 3500

## 2022-12-21 MED ORDER — FLUTICASONE PROPIONATE 50 MCG/ACT NA SUSP: 2.0000 | Freq: Every day | NASAL | Status: DC

## 2022-12-21 MED ORDER — DOCUSATE SODIUM 100 MG PO CAPS: 100.0000 mg | ORAL_CAPSULE | Freq: Two times a day (BID) | ORAL | Status: DC | PRN

## 2022-12-21 MED ORDER — SODIUM CHLORIDE 0.9 % IV BOLUS
1000.0000 mL | Freq: Once | INTRAVENOUS | Status: AC
  Administered 2022-12-21: 1000 mL via INTRAVENOUS

## 2022-12-21 MED ORDER — ONDANSETRON HCL 4 MG/2ML IJ SOLN
4.0000 mg | Freq: Once | INTRAMUSCULAR | Status: AC
  Administered 2022-12-21: 4 mg via INTRAVENOUS
  Filled 2022-12-21: qty 2

## 2022-12-21 MED ORDER — GUANFACINE HCL ER 1 MG PO TB24
4.0000 mg | ORAL_TABLET | Freq: Every morning | ORAL | Status: DC
  Administered 2022-12-22 – 2022-12-23 (×2): 4 mg via ORAL
  Filled 2022-12-21 (×3): qty 4

## 2022-12-21 MED ORDER — LIDOCAINE-PRILOCAINE 2.5-2.5 % EX CREA
TOPICAL_CREAM | Freq: Once | CUTANEOUS | Status: AC
  Administered 2022-12-21: 1 via TOPICAL
  Filled 2022-12-21 (×2): qty 5

## 2022-12-21 NOTE — Progress Notes (Signed)
ANTICOAGULATION CONSULT NOTE - Follow Up Consult  Pharmacy Consult for heparin Indication: PE, DVT   No Known Allergies  Patient Measurements: Height: 5\' 1"  (154.9 cm) Weight: 86.2 kg (190 lb) IBW/kg (Calculated) : 52.3 Heparin Dosing Weight: 72kg  Vital Signs: Temp: 98.8 F (37.1 C) (03/29 1717) Temp Source: Oral (03/29 1717) BP: 102/58 (03/29 2015) Pulse Rate: 96 (03/29 2015)  Labs: Recent Labs    12/14/2022 1342 12/22/2022 1414 11/23/2022 1415  HGB 18.3* 18.7* 18.4*  HCT 52.5* 55.0* 54.0*  PLT 243  --   --   CREATININE 1.46* 1.40*  --   TROPONINIHS 40*  --   --      Estimated Creatinine Clearance: 72.6 mL/min (A) (by C-G formula based on SCr of 1.4 mg/dL (H)).   Medical History: Past Medical History:  Diagnosis Date   ADHD (attention deficit hyperactivity disorder)    Down's syndrome     Assessment: 89 YOM presenting with foot pain, no pulses with doppler, he is not on anticoagulation PTA. Found to have extensive DVT and PE w/ RHC. Currently requiring 3L O2 down from 6L. Mild hypotension, MAP > 65. CBC stable.   Heparin level 0.19. No issues w infusion per nursing.   Goal of Therapy:  Heparin level 0.3-0.7 units/ml Monitor platelets by anticoagulation protocol: Yes   Plan:  Bolus another 2000 u of heparin.  Increase heparin to 1350u/hr. F/u 6 hour heparin level Monitor H & H.    Esmeralda Arthur, PharmD, [facility] Clinical Pharmacist ED Pharmacist Phone # [phone] 12/15/2022 9:28 PM

## 2022-12-21 NOTE — Progress Notes (Signed)
ANTICOAGULATION CONSULT NOTE - Initial Consult  Pharmacy Consult for heparin Indication: VTE  No Known Allergies  Patient Measurements: Height: 5\' 1"  (154.9 cm) Weight: 86.2 kg (190 lb) IBW/kg (Calculated) : 52.3 Heparin Dosing Weight: 72kg  Vital Signs: BP: 100/47 (03/29 1314) Pulse Rate: 112 (03/29 1314)  Labs: Recent Labs    12/22/2022 1342 11/27/2022 1414 12/05/2022 1415  HGB 18.3* 18.7* 18.4*  HCT 52.5* 55.0* 54.0*  PLT 243  --   --   CREATININE  --  1.40*  --     Estimated Creatinine Clearance: 72.6 mL/min (A) (by C-G formula based on SCr of 1.4 mg/dL (H)).   Medical History: Past Medical History:  Diagnosis Date   ADHD (attention deficit hyperactivity disorder)    Down's syndrome     Assessment: 36 YOM presenting with foot pain, no pulses with doppler, he is not on anticoagulation PTA, concern for ischemia and VTE  Goal of Therapy:  Heparin level 0.3-0.7 units/ml Monitor platelets by anticoagulation protocol: Yes   Plan:  Heparin 3500 units IV x 1, and gtt at 1200 units/hr  F/u 6 hour heparin level F/u VTE workup  Bertis Ruddy, PharmD, [facility] Clinical Pharmacist ED Pharmacist Phone # [phone] 12/08/2022 3:12 PM

## 2022-12-21 NOTE — ED Triage Notes (Addendum)
Pt came in via POV d/t Lt leg/foot pain that started today. His foot appears pale & blue, no pulse heard in Lt foot with doppler in triage. Pt family at bedside with to assist pt with down syndrome, he is anxious but cooperative. Mother reports he was cold & clammy last night.

## 2022-12-21 NOTE — ED Notes (Signed)
Pt gone to CT 

## 2022-12-21 NOTE — Progress Notes (Signed)
LLE venous duplex has been completed.  Preliminary results given to Dr. Truett Mainland.   Results can be found under chart review under CV PROC. 12/05/2022 4:51 PM Raeleigh Guinn RVT, RDMS

## 2022-12-21 NOTE — H&P (Signed)
NAME:  Jeremy [name], MRN:  [id number], DOB:  March 16, 1991, LOS: 0 ADMISSION DATE:  12/22/2022, CONSULTATION DATE:  12/07/2022 REFERRING MD:  Zenia Resides, CHIEF COMPLAINT:  swollen leg   History of Present Illness:  Jeremy [name] is a 32 y.o. M with PMH significant for ADHD, OSA and Down's Syndrome who presented to the ED with one day of  L leg and foot pain, family noted the leg looking pale and blue so brought him to the ED.  No recent travel, surgeries or trauma.   His work-up in the ED was significant for acute  and extensive DVT of the L leg.  His oxygen saturations were in the low 90%'s and he required 2-3L [place] O2 but was not tachycardic or hypotensive.  CTA chest revealed acute PE with RV/LV ratio of 1.42. labs showed creatinine of 1.46, BNP 137,  troponin 40, WBC 12, lactic acid pending.  He was started on heparin and PCCM consulted. Larri denies any chest pain or shortness of breath.   Pertinent  Medical History   has a past medical history of ADHD (attention deficit hyperactivity disorder) and Down's syndrome. OSA   Significant Hospital Events: Including procedures, antibiotic start and stop dates in addition to other pertinent events   3/29 presented to the ED with L swollen leg, found to have DVT and PE  Interim History / Subjective:  Pt calm and in no distress on [place] oxygebn  Objective   Blood pressure (!) 102/58, pulse 96, temperature 98.8 F (37.1 C), temperature source Oral, resp. rate 18, height 5\' 1"  (1.549 m), weight 86.2 kg, SpO2 93 %.        Intake/Output Summary (Last 24 hours) at 12/22/2022 2110 Last data filed at 12/07/2022 1536 Gross per 24 hour  Intake 1000.17 ml  Output --  Net 1000.17 ml   Filed Weights   12/03/2022 1314  Weight: 86.2 kg    General:  well nourished young M, sitting up in bed in no acute distress HEENT: MM pink/moist Neuro: awake and alert, answering yes/no questions, follows commands CV: s1s2 rrr, no m/r/g PULM:  clear bilaterally GI:  soft, non-tender Extremities: warm/dry, diffusely erythematous LLE with mild palpable pulses  Skin: no rashes or lesions   Resolved Hospital Problem list     Assessment & Plan:    Acute hypoxic respiratory failure secondary to acute submassive PE Evidence of R heart strain -admit to ICU for close monitoring tonight -continue heparin, currently stable, no indication for lytics at the current time -consult IR, do not think emergent intervention likely indicated at this time, but would like IR to be aware of patient  -echo -obtain and trend lactic acid, trend troponin -vascular has been consulted regarding extensive DVT -q1hr pulse checks LLE overnight   AKI Presenting creatinine 1.46, last baseline 0.91 -IVF bolus, avoid nephrotoxins, monitor BMP and UOP  Hyperglycemia -SSI and A1c    OSA Mom reports recently diagnosed and just started wearing CPAP at night -CPAP qhs  Best Practice (right click and "Reselect all SmartList Selections" daily)   Diet/type: Regular consistency (see orders) DVT prophylaxis: systemic heparin GI prophylaxis: N/A Lines: N/A Foley:  N/A Code Status:  full code Last date of multidisciplinary goals of care discussion [pending]  Labs   CBC: Recent Labs  Lab 12/16/2022 1342 12/22/2022 1414 11/25/2022 1415  WBC 12.1*  --   --   NEUTROABS 8.2*  --   --   HGB 18.3* 18.7* 18.4*  HCT 52.5*  55.0* 54.0*  MCV 97.4  --   --   PLT 243  --   --     Basic Metabolic Panel: Recent Labs  Lab 12/02/2022 1342 12/15/2022 1414 12/15/2022 1415  NA 137 138 138  K 4.9 5.1 5.0  CL 99 102  --   CO2 23  --   --   GLUCOSE 150* 148*  --   BUN 14 15  --   CREATININE 1.46* 1.40*  --   CALCIUM 9.2  --   --    GFR: Estimated Creatinine Clearance: 72.6 mL/min (A) (by C-G formula based on SCr of 1.4 mg/dL (H)). Recent Labs  Lab 11/26/2022 1342  WBC 12.1*    Liver Function Tests: Recent Labs  Lab 12/02/2022 1342  AST 17  ALT 22  ALKPHOS 160*  BILITOT 1.0   PROT 8.7*  ALBUMIN 3.7   No results for input(s): "LIPASE", "AMYLASE" in the last 168 hours. No results for input(s): "AMMONIA" in the last 168 hours.  ABG    Component Value Date/Time   HCO3 25.8 12/17/2022 1415   TCO2 27 12/09/2022 1415   O2SAT 66 11/27/2022 1415     Coagulation Profile: No results for input(s): "INR", "PROTIME" in the last 168 hours.  Cardiac Enzymes: No results for input(s): "CKTOTAL", "CKMB", "CKMBINDEX", "TROPONINI" in the last 168 hours.  HbA1C: No results found for: "HGBA1C"  CBG: No results for input(s): "GLUCAP" in the last 168 hours.  Review of Systems:   Please see the history of present illness. All other systems reviewed and are negative    Past Medical History:  He,  has a past medical history of ADHD (attention deficit hyperactivity disorder) and Down's syndrome.   Surgical History:   Past Surgical History:  Procedure Laterality Date   Left testicular surger     Miringotomy with tubes     TONSILLECTOMY       Social History:   reports that he has never smoked. He has never used smokeless tobacco. He reports that he does not drink alcohol and does not use drugs.   Family History:  His family history is not on file.   Allergies No Known Allergies   Home Medications  Prior to Admission medications   Medication Sig Start Date End Date Taking? Authorizing Provider  acetaminophen (TYLENOL) 325 MG tablet Take 2 tablets (650 mg total) by mouth every 6 (six) hours as needed for mild pain (or Fever >/= 101). 04/11/20   Nita Sells, MD  ascorbic acid (VITAMIN C) 500 MG tablet Take 1 tablet (500 mg total) by mouth daily. 04/12/20   Nita Sells, MD  atomoxetine (STRATTERA) 60 MG capsule Take 60 mg by mouth daily.    [provider]  Chlorpheniramine Maleate (ALLERGY PO) Take 1 tablet by mouth in the morning and at bedtime.    [provider]  cloNIDine (CATAPRES) 0.1 MG tablet Take 0.1 mg by mouth every  evening.  04/02/20   [provider]  guanFACINE (INTUNIV) 4 MG TB24 ER tablet Take 4 mg by mouth every morning. 02/02/20   [provider]  methylphenidate 54 MG PO CR tablet Take 108 mg by mouth every morning.    [provider]  mometasone (NASONEX) 50 MCG/ACT nasal spray Place 2 sprays into the nose daily as needed (allergies).  08/26/17   [provider]  Olopatadine HCl (PATADAY) 0.2 % SOLN Place 1 drop into both eyes daily as needed (allergies).  12/23/18  [provider]  simvastatin (ZOCOR) 40 MG tablet Take 40 mg by mouth every evening.  04/04/20   [provider]     Critical care time:  45 minutes     CRITICAL CARE Performed by: Otilio Carpen Amanada Philbrick   Total critical care time: 45 minutes  Critical care time was exclusive of separately billable procedures and treating other patients.  Critical care was necessary to treat or prevent imminent or life-threatening deterioration.  Critical care was time spent personally by me on the following activities: development of treatment plan with patient and/or surrogate as well as nursing, discussions with consultants, evaluation of patient's response to treatment, examination of patient, obtaining history from patient or surrogate, ordering and performing treatments and interventions, ordering and review of laboratory studies, ordering and review of radiographic studies, pulse oximetry and re-evaluation of patient's condition.  Otilio Carpen Thaxton Pelley, PA-C [facility] See Amion for pager If no response to pager , please call [phone] until 7pm After 7:00 pm call Elink  [phone]?[place]

## 2022-12-21 NOTE — ED Notes (Signed)
Pt gone to vascular ? ?

## 2022-12-21 NOTE — Consult Note (Signed)
[facility] [place]  Patient name: Jeremy [name] MRN: [id number] DOB: 1991/02/13 Sex: male   REQUESTING PROVIDER:   ER   REASON FOR CONSULT:    DVT  HISTORY OF PRESENT ILLNESS:   Jeremy [name] is a 32 y.o. male, who presented to the emergency department with a 1 day history of left leg swelling and bluish discoloration.  He was found to have an extensive left leg DVT.  His oxygen levels were in the low 90s and he underwent a CT scan that shows an acute PE with right heart strain.  He was started on [name] heparin.  A code PE was called.  He denies any chest pain or shortness of breath he is being admitted to the ICU for further monitoring.  On further discussions with the patient's parents, he is relatively sedentary.  He denies any significant travel history.  He denies any prior thrombotic issues.  He is a non-smoker.  PAST MEDICAL HISTORY    Past Medical History:  Diagnosis Date   ADHD (attention deficit hyperactivity disorder)    Down's syndrome      FAMILY HISTORY   History reviewed. No pertinent family history.  SOCIAL HISTORY:   Social History   Socioeconomic History   Marital status: Married    Spouse name: Not on file   Number of children: Not on file   Years of education: Not on file   Highest education level: Not on file  Occupational History   Not on file  Tobacco Use   Smoking status: Never   Smokeless tobacco: Never  Substance and Sexual Activity   Alcohol use: No   Drug use: No   Sexual activity: Not on file  Other Topics Concern   Not on file  Social History Narrative   Not on file   Social Determinants of Health   Financial Resource Strain: Not on file  Food Insecurity: Not on file  Transportation Needs: Not on file  Physical Activity: Not on file  Stress: Not on file  Social Connections: Not on file  Intimate Partner Violence: Not on file    ALLERGIES:    No Known  Allergies  CURRENT MEDICATIONS:    Current Facility-Administered Medications  Medication Dose Route Frequency Provider Last Rate Last Admin   atomoxetine (STRATTERA) capsule 60 mg  60 mg Oral Daily Gleason, Otilio Carpen, PA-C       docusate sodium (COLACE) capsule 100 mg  100 mg Oral BID PRN Gleason, Otilio Carpen, PA-C       fluticasone (FLONASE) 50 MCG/ACT nasal spray 2 spray  2 spray Each Nare Daily Gleason, Otilio Carpen, PA-C       [START ON 12/22/2022] guanFACINE (INTUNIV) ER tablet 4 mg  4 mg Oral q morning Gleason, Otilio Carpen, PA-C       heparin ADULT infusion 100 units/mL (25000 units/245mL)  1,350 Units/hr Intravenous Continuous Ventura Sellers, RPH 12 mL/hr at 11/30/2022 2219 1,200 Units/hr at 12/12/2022 2219   insulin aspart (novoLOG) injection 0-5 Units  0-5 Units Subcutaneous QHS Gleason, Otilio Carpen, PA-C       [START ON 12/22/2022] insulin aspart (novoLOG) injection 0-9 Units  0-9 Units Subcutaneous TID WC Gleason, Otilio Carpen, PA-C       [START ON 12/22/2022] methylphenidate (CONCERTA) CR tablet 123XX123 mg  108 mg Oral BH-q7a Gleason, Otilio Carpen, PA-C       olopatadine (PATANOL) 0.1 % ophthalmic solution 1 drop  1 drop  Both Eyes BID Gleason, Otilio Carpen, PA-C       polyethylene glycol (MIRALAX / GLYCOLAX) packet 17 g  17 g Oral Daily PRN Gleason, Otilio Carpen, PA-C       Current Outpatient Medications  Medication Sig Dispense Refill   acetaminophen (TYLENOL) 325 MG tablet Take 2 tablets (650 mg total) by mouth every 6 (six) hours as needed for mild pain (or Fever >/= 101).     ascorbic acid (VITAMIN C) 500 MG tablet Take 1 tablet (500 mg total) by mouth daily. 30 tablet 0   atomoxetine (STRATTERA) 60 MG capsule Take 60 mg by mouth daily.     Chlorpheniramine Maleate (ALLERGY PO) Take 1 tablet by mouth in the morning and at bedtime.     cloNIDine (CATAPRES) 0.1 MG tablet Take 0.1 mg by mouth every evening.      guanFACINE (INTUNIV) 4 MG TB24 ER tablet Take 4 mg by mouth every morning.     methylphenidate 54 MG PO CR tablet  Take 108 mg by mouth every morning.     mometasone (NASONEX) 50 MCG/ACT nasal spray Place 2 sprays into the nose daily as needed (allergies).      Olopatadine HCl (PATADAY) 0.2 % SOLN Place 1 drop into both eyes daily as needed (allergies).      simvastatin (ZOCOR) 40 MG tablet Take 40 mg by mouth every evening.       REVIEW OF SYSTEMS:   [X]  denotes positive finding, [ ]  denotes negative finding Cardiac  Comments:  Chest pain or chest pressure:    Shortness of breath upon exertion:    Short of breath when lying flat:    Irregular heart rhythm:        Vascular    Pain in calf, thigh, or hip brought on by ambulation:    Pain in feet at night that wakes you up from your sleep:     Blood clot in your veins:    Leg swelling:  x       Pulmonary    Oxygen at home:    Productive cough:     Wheezing:         Neurologic    Sudden weakness in arms or legs:     Sudden numbness in arms or legs:     Sudden onset of difficulty speaking or slurred speech:    Temporary loss of vision in one eye:     Problems with dizziness:         Gastrointestinal    Blood in stool:      Vomited blood:         Genitourinary    Burning when urinating:     Blood in urine:        Psychiatric    Major depression:         Hematologic    Bleeding problems:    Problems with blood clotting too easily:        Skin    Rashes or ulcers:        Constitutional    Fever or chills:     PHYSICAL EXAM:   Vitals:   12/03/2022 1715 12/20/2022 1717 12/09/2022 2015 12/18/2022 2143  BP: 106/69  (!) 102/58 130/64  Pulse: (!) 106  96 94  Resp: 18  18 18   Temp:  98.8 F (37.1 C)  99.4 F (37.4 C)  TempSrc:  Oral  Oral  SpO2: 93%  93% 94%  Weight:  Height:        GENERAL: The patient is a well-nourished male, in no acute distress. The vital signs are documented above. CARDIAC: There is a regular rate and rhythm.  VASCULAR: Extensive left leg edema with bluish discoloration.  Faintly palpable pedal  pulses PULMONARY: Nonlabored respirations  MUSCULOSKELETAL: There are no major deformities or cyanosis. NEUROLOGIC: Motor and sensory function are intact in the left foot. SKIN: There are no ulcers or rashes noted. PSYCHIATRIC: The patient has a normal affect.  STUDIES:   I have reviewed the following studies: Lower extremity venous duplex: RIGHT:  - No evidence of common femoral vein obstruction.  - No cystic structure found in the popliteal fossa.    LEFT:  - Findings consistent with acute deep vein thrombosis involving the left  common femoral vein, SF junction, left femoral vein, left proximal  profunda vein, left popliteal vein, left posterior tibial veins, and left  peroneal veins.  - Findings consistent with acute superficial vein thrombosis involving the  left great saphenous vein.   CT angiogram: 1. Positive for acute PE with CT evidence of right heart strain (RV/LV Ratio = 1.42) consistent with at least submassive (intermediate risk) PE. The presence of right heart strain has been associated with an increased risk of morbidity and mortality. Please refer to the "Code PE Focused" order set in EPIC. 2. Trace pericardial effusion. 3. Trace left pleural effusion. 4. Mediastinal adenopathy, nonspecific.  Abdominal CT scan: 1. Acute deep venous thrombosis of the left common femoral and left external iliac vein, with surrounding reactive fat stranding. 2. Bilateral pulmonary emboli, please see corresponding CT angiography chest report. 3. Trace left pleural effusion.  Trace pericardial effusion. ASSESSMENT and PLAN   Extensive left leg DVT with PE: I discussed with the patient and his family that addressing his PE is top priority.  He is going to be admitted to the ICU under the code PE protocol.  He does have a significant clot burden in his pulmonary arteries.  He is on oxygen and tachycardic.  I suspect that he will likely require pulmonary intervention.  In addition he  has an extensive left leg DVT.  I discussed with the family that I would recommend intervention on his left leg DVT once his acute pulmonary issues are stabilized.  I discussed that this would be performed to limit his risk for postphlebitic syndrome and chronic issues with a swollen leg.  I will continue to monitor him through the weekend.  No intervention on the left leg is recommended currently as he does not have phlegmasia.  We will plan on left leg thrombectomy early next week.   Leia Alf, MD, FACS [facility] [phone] Pager [phone]

## 2022-12-21 NOTE — ED Provider Notes (Addendum)
[facility] Provider Note  CSN: [id number] Arrival date & time: 11/27/2022 1300  Chief Complaint(s) No chief complaint on file.  HPI Jeremy [name] IV is a 32 y.o. male history of Down syndrome presenting to the emergency department with leg swelling.  Patient's family reports that this morning they noticed that his leg was red and swollen.  Has never had any prior blood clots.  Patient does report left leg pain and also reports chest pain.  He denies shortness of breath.  No recent travel or surgeries.  History is limited due to chronic total actual disability   Past Medical History Past Medical History:  Diagnosis Date   ADHD (attention deficit hyperactivity disorder)    Down's syndrome    Patient Active Problem List   Diagnosis Date Noted   Acute respiratory disease due to COVID-19 virus 04/10/2020   Down syndrome 04/10/2020   Hypokalemia 04/10/2020   COVID-19 virus vaccination not done 04/10/2020   Home Medication(s) Prior to Admission medications   Medication Sig Start Date End Date Taking? Authorizing Provider  acetaminophen (TYLENOL) 325 MG tablet Take 2 tablets (650 mg total) by mouth every 6 (six) hours as needed for mild pain (or Fever >/= 101). 04/11/20   Nita Sells, MD  ascorbic acid (VITAMIN C) 500 MG tablet Take 1 tablet (500 mg total) by mouth daily. 04/12/20   Nita Sells, MD  atomoxetine (STRATTERA) 60 MG capsule Take 60 mg by mouth daily.    [provider]  Chlorpheniramine Maleate (ALLERGY PO) Take 1 tablet by mouth in the morning and at bedtime.    [provider]  cloNIDine (CATAPRES) 0.1 MG tablet Take 0.1 mg by mouth every evening.  04/02/20   [provider]  guanFACINE (INTUNIV) 4 MG TB24 ER tablet Take 4 mg by mouth every morning. 02/02/20   [provider]  methylphenidate 54 MG PO CR tablet Take 108 mg by mouth every morning.    [provider]   mometasone (NASONEX) 50 MCG/ACT nasal spray Place 2 sprays into the nose daily as needed (allergies).  08/26/17   [provider]  Olopatadine HCl (PATADAY) 0.2 % SOLN Place 1 drop into both eyes daily as needed (allergies).  12/23/18   [provider]  simvastatin (ZOCOR) 40 MG tablet Take 40 mg by mouth every evening.  04/04/20   [provider]                                                                                                                                    Past Surgical History Past Surgical History:  Procedure Laterality Date   Left testicular surger     Miringotomy with tubes     TONSILLECTOMY     Family History History reviewed. No pertinent family history.  Social History Social History   Tobacco Use   Smoking status: Never  Smokeless tobacco: Never  Substance Use Topics   Alcohol use: No   Drug use: No   Allergies Patient has no known allergies.  Review of Systems Review of Systems  All other systems reviewed and are negative.   Physical Exam Vital Signs  I have reviewed the triage vital signs BP (!) 100/47 (BP Location: Right Arm)   Pulse (!) 112   Resp (!) 21   Ht 5\' 1"  (1.549 m)   Wt 86.2 kg   SpO2 92%   BMI 35.90 kg/m  Physical Exam Vitals and nursing note reviewed.  Constitutional:      General: He is not in acute distress.    Appearance: Normal appearance.  HENT:     Mouth/Throat:     Mouth: Mucous membranes are moist.  Eyes:     Conjunctiva/sclera: Conjunctivae normal.  Cardiovascular:     Rate and Rhythm: Regular rhythm. Tachycardia present.  Pulmonary:     Effort: Pulmonary effort is normal. No respiratory distress.     Breath sounds: Normal breath sounds.  Abdominal:     General: Abdomen is flat.     Palpations: Abdomen is soft.     Tenderness: There is no abdominal tenderness.  Musculoskeletal:     Right lower leg: No edema.     Left lower leg: Edema present.     Comments: Red and  erythematous leg, diffusely tender  Skin:    General: Skin is warm and dry.     Capillary Refill: Capillary refill takes less than 2 seconds.     Comments: Skin warm and dry, contrary to triage note does not appear pale  Neurological:     General: No focal deficit present.     Mental Status: He is alert. Mental status is at baseline.  Psychiatric:        Mood and Affect: Mood normal.        Behavior: Behavior normal.     ED Results and Treatments Labs (all labs ordered are listed, but only abnormal results are displayed) Labs Reviewed  COMPREHENSIVE METABOLIC PANEL - Abnormal; Notable for the following components:      Result Value   Glucose, Bld 150 (*)    Creatinine, Ser 1.46 (*)    Total Protein 8.7 (*)    Alkaline Phosphatase 160 (*)    All other components within normal limits  CBC WITH DIFFERENTIAL/PLATELET - Abnormal; Notable for the following components:   WBC 12.1 (*)    Hemoglobin 18.3 (*)    HCT 52.5 (*)    Neutro Abs 8.2 (*)    Abs Immature Granulocytes 0.10 (*)    All other components within normal limits  BRAIN NATRIURETIC PEPTIDE - Abnormal; Notable for the following components:   B Natriuretic Peptide 137.4 (*)    All other components within normal limits  I-STAT VENOUS BLOOD GAS, ED - Abnormal; Notable for the following components:   Calcium, Ion 1.13 (*)    HCT 54.0 (*)    Hemoglobin 18.4 (*)    All other components within normal limits  I-STAT CHEM 8, ED - Abnormal; Notable for the following components:   Creatinine, Ser 1.40 (*)    Glucose, Bld 148 (*)    Calcium, Ion 0.95 (*)    Hemoglobin 18.7 (*)    HCT 55.0 (*)    All other components within normal limits  TROPONIN I (HIGH SENSITIVITY) - Abnormal; Notable for the following components:   Troponin I (High Sensitivity) 40 (*)  All other components within normal limits  HEPARIN LEVEL (UNFRACTIONATED)  TROPONIN I (HIGH SENSITIVITY)                                                                                                                           Radiology No results found.  Pertinent labs & imaging results that were available during my care of the patient were reviewed by me and considered in my medical decision making (see MDM for details).  Medications Ordered in ED Medications  heparin ADULT infusion 100 units/mL (25000 units/244mL) (1,200 Units/hr Intravenous New Bag/Given 11/25/2022 1535)  sodium chloride 0.9 % bolus 1,000 mL (0 mLs Intravenous Stopped 11/25/2022 1536)  morphine (PF) 4 MG/ML injection 4 mg (4 mg Intravenous Given 11/26/2022 1436)  ondansetron (ZOFRAN) injection 4 mg (4 mg Intravenous Given 12/16/2022 1427)  heparin bolus via infusion 3,500 Units (3,500 Units Intravenous Bolus from Bag 12/04/2022 1536)                                                                                                                                     Procedures .Critical Care  Performed by: Cristie Hem, MD Authorized by: Cristie Hem, MD   Critical care provider statement:    Critical care time (minutes):  30   Critical care was necessary to treat or prevent imminent or life-threatening deterioration of the following conditions:  Respiratory failure and circulatory failure   Critical care was time spent personally by me on the following activities:  Development of treatment plan with patient or surrogate, discussions with consultants, evaluation of patient's response to treatment, examination of patient, ordering and review of laboratory studies, ordering and review of radiographic studies, ordering and performing treatments and interventions, pulse oximetry, re-evaluation of patient's condition and review of old charts Ultrasound ED DVT  Date/Time: 11/29/2022 3:55 PM  Performed by: Cristie Hem, MD Authorized by: Cristie Hem, MD   Procedure details:    Indications: lower extremity pain     Assessment for:  DVT   Images Archived: Yes   LLE Findings:     Left common femoral vein:  Not compressible IMPRESSION:   DVT:     L common femoral vein Comments:     Additionally visualized pulsatile DP pulse of LLE   (including critical care time)  Medical Decision Making / ED Course   MDM:  32 year old  with no prior history of DVT presenting to the emergency department with left leg pain.  Clinically, patient presentation concerning for phlegmasia cerulea dolins.  Patient also tachycardic and hypoxic, requiring supplemental oxygen, concerning for pulmonary embolism, he is also complaining of chest pain.  Bedside ultrasound performed, does have a noncompressible left femoral vein concerning for DVT.  Given his tachycardia and hypoxia we will go ahead and start heparin.  Labs notable for elevated troponin, BNP concerning for heart involvement from pulmonary embolism.  No focal pulmonary findings on exam, as patient is going to get CT angiography of the chest anyways will wait for CTA chest to evaluate lung parenchyma but doubt pneumothorax or pneumonia, no fevers or chills, cough.  Of note triage note reported that his left foot was cool and clammy and had no pulse, on my evaluation his foot is warm and well-perfused with less than 2-second capillary refill as well as a pulsatile DP pulse on bedside ultrasound  Signed out to Dr. Zenia Resides pending CTA chest      Additional history obtained: -Additional history obtained from family -External records from outside source obtained and reviewed including: Chart review including previous notes, labs, imaging, consultation notes including visit for OSA 11/12/22   Lab Tests: -I ordered, reviewed, and interpreted labs.   The pertinent results include:   Labs Reviewed  COMPREHENSIVE METABOLIC PANEL - Abnormal; Notable for the following components:      Result Value   Glucose, Bld 150 (*)    Creatinine, Ser 1.46 (*)    Total Protein 8.7 (*)    Alkaline Phosphatase 160 (*)    All other components  within normal limits  CBC WITH DIFFERENTIAL/PLATELET - Abnormal; Notable for the following components:   WBC 12.1 (*)    Hemoglobin 18.3 (*)    HCT 52.5 (*)    Neutro Abs 8.2 (*)    Abs Immature Granulocytes 0.10 (*)    All other components within normal limits  BRAIN NATRIURETIC PEPTIDE - Abnormal; Notable for the following components:   B Natriuretic Peptide 137.4 (*)    All other components within normal limits  I-STAT VENOUS BLOOD GAS, ED - Abnormal; Notable for the following components:   Calcium, Ion 1.13 (*)    HCT 54.0 (*)    Hemoglobin 18.4 (*)    All other components within normal limits  I-STAT CHEM 8, ED - Abnormal; Notable for the following components:   Creatinine, Ser 1.40 (*)    Glucose, Bld 148 (*)    Calcium, Ion 0.95 (*)    Hemoglobin 18.7 (*)    HCT 55.0 (*)    All other components within normal limits  TROPONIN I (HIGH SENSITIVITY) - Abnormal; Notable for the following components:   Troponin I (High Sensitivity) 40 (*)    All other components within normal limits  HEPARIN LEVEL (UNFRACTIONATED)  TROPONIN I (HIGH SENSITIVITY)    Notable for positive troponin, BNP,    Medicines ordered and prescription drug management: Meds ordered this encounter  Medications   sodium chloride 0.9 % bolus 1,000 mL   morphine (PF) 4 MG/ML injection 4 mg   ondansetron (ZOFRAN) injection 4 mg   heparin bolus via infusion 3,500 Units   heparin ADULT infusion 100 units/mL (25000 units/25mL)    -I have reviewed the patients home medicines and have made adjustments as needed  Cardiac Monitoring: The patient was maintained on a cardiac monitor.  I personally viewed and interpreted the cardiac monitored which showed an  underlying rhythm of: sinus tachycardia  Social Determinants of Health:  Diagnosis or treatment significantly limited by social determinants of health: obesity   Reevaluation: After the interventions noted above, I reevaluated the patient and found that  their symptoms have improved  Co morbidities that complicate the patient evaluation  Past Medical History:  Diagnosis Date   ADHD (attention deficit hyperactivity disorder)    Down's syndrome       Dispostion: Disposition decision including need for hospitalization was considered, and patient disposition pending at time of sign out.    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  Despite best efforts to proofread,  errors can occur which can change the documentation meaning.    Cristie Hem, MD 12/15/2022 1603    Cristie Hem, MD 11/25/2022 1950

## 2022-12-21 NOTE — ED Provider Notes (Signed)
Patient signed to me by prior provider pending results of CT which was positive for pulmonary embolism with right heart strain.  Will speak with intensivist.  Patient had been heparinized by prior provider.  He also has evidence of severe DVT.  Will touch base with vascular surgery.  Plan will be for admission   Lacretia Leigh, MD 11/28/2022 2019

## 2022-12-22 ENCOUNTER — Inpatient Hospital Stay (HOSPITAL_COMMUNITY): Payer: Medicaid Other

## 2022-12-22 DIAGNOSIS — I80202 Phlebitis and thrombophlebitis of unspecified deep vessels of left lower extremity: Secondary | ICD-10-CM | POA: Diagnosis not present

## 2022-12-22 DIAGNOSIS — I2609 Other pulmonary embolism with acute cor pulmonale: Secondary | ICD-10-CM

## 2022-12-22 DIAGNOSIS — R0989 Other specified symptoms and signs involving the circulatory and respiratory systems: Secondary | ICD-10-CM

## 2022-12-22 DIAGNOSIS — R0902 Hypoxemia: Secondary | ICD-10-CM | POA: Diagnosis not present

## 2022-12-22 DIAGNOSIS — I82492 Acute embolism and thrombosis of other specified deep vein of left lower extremity: Secondary | ICD-10-CM | POA: Diagnosis not present

## 2022-12-22 LAB — BASIC METABOLIC PANEL
Anion gap: 10 (ref 5–15)
BUN: 12 mg/dL (ref 6–20)
CO2: 24 mmol/L (ref 22–32)
Calcium: 8.5 mg/dL — ABNORMAL LOW (ref 8.9–10.3)
Chloride: 101 mmol/L (ref 98–111)
Creatinine, Ser: 1.23 mg/dL (ref 0.61–1.24)
GFR, Estimated: >60 mL/min (ref >60)
Glucose, Bld: 137 mg/dL — ABNORMAL HIGH (ref 70–99)
Potassium: 4 mmol/L (ref 3.5–5.1)
Sodium: 135 mmol/L (ref 135–145)

## 2022-12-22 LAB — HEPARIN LEVEL (UNFRACTIONATED)
Heparin Unfractionated: 0.26 IU/mL — ABNORMAL LOW (ref 0.30–0.70)
Heparin Unfractionated: 0.29 IU/mL — ABNORMAL LOW (ref 0.30–0.70)

## 2022-12-22 LAB — ECHOCARDIOGRAM COMPLETE
AR max vel: 2.17 cm2
AV Area VTI: 2.36 cm2
AV Area mean vel: 2.14 cm2
AV Mean grad: 3 mmHg
AV Peak grad: 5.4 mmHg
Ao pk vel: 1.16 m/s
Area-P 1/2: 3.89 cm2
Height: 61 in
S' Lateral: 2.4 cm
Weight: 3107.6 oz

## 2022-12-22 LAB — CBC
HCT: 43.3 % (ref 39.0–52.0)
Hemoglobin: 14.4 g/dL (ref 13.0–17.0)
MCH: 33.3 pg (ref 26.0–34.0)
MCHC: 33.3 g/dL (ref 30.0–36.0)
MCV: 100.2 fL — ABNORMAL HIGH (ref 80.0–100.0)
Platelets: 225 10*3/uL (ref 150–400)
RBC: 4.32 MIL/uL (ref 4.22–5.81)
RDW: 13.8 % (ref 11.5–15.5)
WBC: 15 10*3/uL — ABNORMAL HIGH (ref 4.0–10.5)
nRBC: 0 % (ref 0.0–0.2)

## 2022-12-22 LAB — MAGNESIUM: Magnesium: 1.9 mg/dL (ref 1.7–2.4)

## 2022-12-22 LAB — HIV ANTIBODY (ROUTINE TESTING W REFLEX): HIV Screen 4th Generation wRfx: NONREACTIVE

## 2022-12-22 LAB — GLUCOSE, CAPILLARY
Glucose-Capillary: 194 mg/dL — ABNORMAL HIGH (ref 70–99)
Glucose-Capillary: 186 mg/dL — ABNORMAL HIGH (ref 70–99)

## 2022-12-22 LAB — MRSA NEXT GEN BY PCR, NASAL: MRSA by PCR Next Gen: NOT DETECTED

## 2022-12-22 LAB — LACTIC ACID, PLASMA: Lactic Acid, Venous: 1.6 mmol/L (ref 0.5–1.9)

## 2022-12-22 LAB — TROPONIN I (HIGH SENSITIVITY): Troponin I (High Sensitivity): 29 ng/L — ABNORMAL HIGH (ref <18)

## 2022-12-22 MED ORDER — ACETAMINOPHEN 325 MG PO TABS
650.0000 mg | ORAL_TABLET | Freq: Four times a day (QID) | ORAL | Status: DC | PRN
  Administered 2022-12-22 – 2022-12-24 (×3): 650 mg via ORAL
  Filled 2022-12-22 (×3): qty 2

## 2022-12-22 MED ORDER — LACTATED RINGERS IV SOLN: INTRAVENOUS | Status: DC

## 2022-12-22 MED ORDER — ORAL CARE MOUTH RINSE: 15.0000 mL | OROMUCOSAL | Status: DC | PRN

## 2022-12-22 MED ORDER — CHLORHEXIDINE GLUCONATE CLOTH 2 % EX PADS
6.0000 | MEDICATED_PAD | Freq: Every day | CUTANEOUS | Status: DC
  Administered 2022-12-22 – 2022-12-24 (×4): 6 via TOPICAL

## 2022-12-22 MED ORDER — DIAZEPAM 2 MG PO TABS
2.0000 mg | ORAL_TABLET | Freq: Four times a day (QID) | ORAL | Status: DC | PRN
  Administered 2022-12-22 (×2): 2 mg via ORAL
  Filled 2022-12-22 (×2): qty 1

## 2022-12-22 MED ORDER — LORAZEPAM 2 MG/ML IJ SOLN
1.0000 mg | Freq: Once | INTRAMUSCULAR | Status: AC
  Administered 2022-12-22: 1 mg via INTRAVENOUS
  Filled 2022-12-22: qty 1

## 2022-12-22 MED ORDER — DEXMEDETOMIDINE HCL IN NACL 400 MCG/100ML IV SOLN
0.0000 ug/kg/h | INTRAVENOUS | Status: DC
  Administered 2022-12-22: 0.4 ug/kg/h via INTRAVENOUS
  Administered 2022-12-23: 0.1 ug/kg/h via INTRAVENOUS
  Filled 2022-12-22 (×2): qty 100

## 2022-12-22 MED ORDER — MAGNESIUM SULFATE 2 GM/50ML IV SOLN
2.0000 g | Freq: Once | INTRAVENOUS | Status: AC
  Administered 2022-12-22: 2 g via INTRAVENOUS
  Filled 2022-12-22: qty 50

## 2022-12-22 NOTE — Progress Notes (Signed)
RT placed pt on home CPAP machine at this time. RN made aware.

## 2022-12-22 NOTE — Progress Notes (Signed)
ANTICOAGULATION CONSULT NOTE - Follow Up Consult  Pharmacy Consult for heparin Indication: PE, DVT   No Known Allergies  Patient Measurements: Height: 5\' 1"  (154.9 cm) Weight: 88.1 kg (194 lb 3.6 oz) (admit 2 H) IBW/kg (Calculated) : 52.3 Heparin Dosing Weight: 72kg  Vital Signs: Temp: 98.2 F (36.8 C) (03/30 1600) Temp Source: Oral (03/30 1600) BP: 108/62 (03/30 1600) Pulse Rate: 85 (03/30 1400)  Labs: Recent Labs    11/24/2022 1342 12/19/2022 1414 12/09/2022 1415 12/02/2022 2113 12/22/22 0013 12/22/22 0421 12/22/22 0659 12/22/22 1435  HGB 18.3* 18.7* 18.4*  --   --   --  14.4  --   HCT 52.5* 55.0* 54.0*  --   --   --  43.3  --   PLT 243  --   --   --   --   --  225  --   HEPARINUNFRC  --   --   --  0.19*  --  0.26*  --  0.29*  CREATININE 1.46* 1.40*  --   --   --   --  1.23  --   TROPONINIHS 40*  --   --  25* 29*  --   --   --      Estimated Creatinine Clearance: 83.5 mL/min (by C-G formula based on SCr of 1.23 mg/dL).   Medical History: Past Medical History:  Diagnosis Date   ADHD (attention deficit hyperactivity disorder)    Down's syndrome     Assessment: 10 YOM presenting with foot pain, no pulses with doppler, he is not on anticoagulation PTA. Found to have extensive DVT and PE large clot burden w/ RH strain. Mild hypotension, MAP > 65.   Discussion ongoing for planning possible mechanical thrombectomy Heparin level 0.29 at bottom of range on heparin drip 1500 uts/hr.   No issues w infusion noted, cbc stable no bleeding noted  Goal of Therapy:  Heparin level 0.3-0.7 units/ml Monitor platelets by anticoagulation protocol: Yes   Plan:  Increase heparin drip to 1650u/hr. Daily cbc and heparin level  Monitor s/s bleeding     Bonnita Nasuti Pharm.D. CPP, BCPS Clinical Pharmacist [phone] 12/22/2022 4:43 PM

## 2022-12-22 NOTE — Progress Notes (Signed)
eLink Physician-Brief Progress Note Patient Name: Jeremy [name] DOB: 1991/07/07 MRN: [id number]   Date of Service  12/22/2022  HPI/Events of Note  Pt anxious and agitated, taking off his nasal cannula and refusing to put in back on.  Says he wants to "get out of here" Desturating to the 69s.  Attempts to reorient him have been unsuccessful.   eICU Interventions  Will give one time dose of ativan.  IF persistently agitated, would give trial of precedex.  Will continue to monitor closely.         [place] 12/22/2022, 10:12 PM

## 2022-12-22 NOTE — Progress Notes (Signed)
eLink Physician-Brief Progress Note Patient Name: DMIR RIEF IV DOB: 13-Sep-1991 MRN: [id number]   Date of Service  12/22/2022  HPI/Events of Note  Patient admitted with left lower extremity DVT, bilateral submassive PE. He has a history of Down's syndrome & OSA.  eICU Interventions  New Patient Evaluation.        Kerry Kass Briena Swingler 12/22/2022, 1:46 AM

## 2022-12-22 NOTE — Progress Notes (Addendum)
    Subjective  -   States that for the first time that he is having some chest pain. His left leg does not bother him   Physical Exam:  Edema slightly improved on left leg No signs of phlegmasia.       Assessment/Plan:    DVT/PE: The patient told me today that he is having some chest pain.  I have never heard him say this before.  He remains on 7 L of oxygen and tachycardic in the low 100s.  CCM is managing his PE.  He is being considered for continued anticoagulation versus pulmonary mechanical thrombectomy, versus systemic tPA.  With regards to his DVT, given the amount of edema in his leg I think that he would be best served with mechanical thrombectomy to prevent postphlebitic syndrome.  With the size of his PE, he will not have a lot of reserve should there be issues with his thrombectomy.  Therefore, he will need to be optimized from a pulmonary perspective before considering left leg intervention.  I will continue to follow him through the weekend.  He is being maintained on anticoagulation.  Will consider thrombectomy next week, possibly Monday.  I spoke to Dr. Tamala Julian with CCM about management of this patient and we will schedule a conference with all parties involved on Monday to discuss definitive plans.  Wells Piper [place] 12/22/2022 12:30 PM --  Vitals:   12/22/22 1145 12/22/22 1200  BP: 134/72   Pulse: (!) 103 (!) 106  Resp: (!) 29 20  Temp:  98.1 F (36.7 C)  SpO2: 94% 95%    Intake/Output Summary (Last 24 hours) at 12/22/2022 1230 Last data filed at 12/22/2022 0700 Gross per 24 hour  Intake 1724.88 ml  Output --  Net 1724.88 ml     Laboratory CBC    Component Value Date/Time   WBC 15.0 (H) 12/22/2022 0659   HGB 14.4 12/22/2022 0659   HCT 43.3 12/22/2022 0659   PLT 225 12/22/2022 0659    BMET    Component Value Date/Time   NA 135 12/22/2022 0659   K 4.0 12/22/2022 0659   CL 101 12/22/2022 0659   CO2 24 12/22/2022 0659   GLUCOSE 137 (H) 12/22/2022  0659   BUN 12 12/22/2022 0659   CREATININE 1.23 12/22/2022 0659   CALCIUM 8.5 (L) 12/22/2022 0659   GFRNONAA >60 12/22/2022 0659   GFRAA >60 04/11/2020 0752    COAG No results found for: "INR", "PROTIME" No results found for: "PTT"  Antibiotics Anti-infectives (From admission, onward)    None        V. Leia Alf, M.D., [facility] [place] Office: [phone] Pager:  [phone]

## 2022-12-22 NOTE — Progress Notes (Signed)
ANTICOAGULATION CONSULT NOTE - Follow Up Consult  Pharmacy Consult for heparin Indication:  PE/DVT  Labs: Recent Labs    12/18/2022 1342 12/02/2022 1414 11/30/2022 1415 12/17/2022 2113 12/22/22 0013 12/22/22 0421  HGB 18.3* 18.7* 18.4*  --   --   --   HCT 52.5* 55.0* 54.0*  --   --   --   PLT 243  --   --   --   --   --   HEPARINUNFRC  --   --   --  0.19*  --  0.26*  CREATININE 1.46* 1.40*  --   --   --   --   TROPONINIHS 40*  --   --  25* 29*  --     Assessment: 31yo male remains slightly subtherapeutic on heparin after rate change; no infusion issues or signs of bleeding per RN.  Goal of Therapy:  Heparin level 0.3-0.7 units/ml   Plan:  Will increase heparin infusion by 2 units/kg/hr to 1500 units/hr and check level in 6 hours.    Wynona Neat, PharmD, BCPS  12/22/2022,5:42 AM

## 2022-12-22 NOTE — Progress Notes (Signed)
   NAME:  MAKELL KEIZER, MRN:  [id number], DOB:  08-03-1991, LOS: 1 ADMISSION DATE:  12/11/2022, CONSULTATION DATE:  12/22/22 REFERRING MD:  Zenia Resides, CHIEF COMPLAINT:  swollen leg   History of Present Illness:  Kainin Hosp is a 32 y.o. M with PMH significant for ADHD, OSA and Down's Syndrome who presented to the ED with one day of  L leg and foot pain, family noted the leg looking pale and blue so brought him to the ED.  No recent travel, surgeries or trauma.   His work-up in the ED was significant for acute  and extensive DVT of the L leg.  His oxygen saturations were in the low 90%'s and he required 2-3L [place] O2 but was not tachycardic or hypotensive.  CTA chest revealed acute PE with RV/LV ratio of 1.42. labs showed creatinine of 1.46, BNP 137,  troponin 40, WBC 12, lactic acid pending.  He was started on heparin and PCCM consulted. Dashay denies any chest pain or shortness of breath.   Pertinent  Medical History   has a past medical history of ADHD (attention deficit hyperactivity disorder) and Down's syndrome. OSA   Significant Hospital Events: Including procedures, antibiotic start and stop dates in addition to other pertinent events   [age] presented to the ED with L swollen leg, found to have DVT and PE  Interim History / Subjective:  Anxious, pulling at lines BP on lower side but that is baseline. Sats 94% 7LPM, denies SOB/dizziness. Wants his IV out.  Objective   Blood pressure (!) 84/62, pulse 79, temperature 99.2 F (37.3 C), temperature source Axillary, resp. rate 17, height 5\' 1"  (1.549 m), weight 88.1 kg, SpO2 95 %.        Intake/Output Summary (Last 24 hours) at 12/22/2022 [id number] Last data filed at 12/22/2022 0700 Gross per 24 hour  Intake 1724.88 ml  Output --  Net 1724.88 ml    Filed Weights   12/10/2022 1314 12/22/22 0100  Weight: 86.2 kg 88.1 kg    Mildly upset about his IV MMM, trachea midline Abdomen soft, hypoactive BS Left cerulea dolens noted Lungs  clear  CBC mild leukocytosis, stable plts/hgb Cr stable CTA large L clot burden and pulmonary trunk dilation noted  Resolved Hospital Problem list   AKI on CKD2  Assessment & Plan:  Submassive PE L phlegmasia cerulea dolens- pulses still intact  Tough case.  Discussed with IR, vascular note reviewed.  If there was an absence of cerulea dolens, no indication for advanced PE therapy.  Low/intermediate PESI score without high risk features.     I think this weekend we do AC, bedrest. If any deterioration systemic vs. cdTPA. If any worsening leg pain, pulse issues in LLE same thing.  Monday need to discuss with VVS on operative candidacy, I think if does okay through weekend should be okay for intervention.  Pulmonary trunk dilation could either indicate this is an acute on chronic process or that he has baseline RV dysfunction which is fairly common in Down's syndrome.   Best Practice (right click and "Reselect all SmartList Selections" daily)   Diet/type: Regular consistency (see orders) DVT prophylaxis: systemic heparin GI prophylaxis: N/A Lines: N/A Foley:  N/A Code Status:  full code Last date of multidisciplinary goals of care discussion [mother updated at bedside]   32 min cc time Erskine Emery MD PCCM

## 2022-12-23 DIAGNOSIS — I80202 Phlebitis and thrombophlebitis of unspecified deep vessels of left lower extremity: Secondary | ICD-10-CM | POA: Diagnosis not present

## 2022-12-23 DIAGNOSIS — R0989 Other specified symptoms and signs involving the circulatory and respiratory systems: Secondary | ICD-10-CM | POA: Diagnosis not present

## 2022-12-23 DIAGNOSIS — I82492 Acute embolism and thrombosis of other specified deep vein of left lower extremity: Secondary | ICD-10-CM | POA: Diagnosis not present

## 2022-12-23 LAB — BASIC METABOLIC PANEL
Anion gap: 8 (ref 5–15)
BUN: 11 mg/dL (ref 6–20)
CO2: 26 mmol/L (ref 22–32)
Calcium: 8.4 mg/dL — ABNORMAL LOW (ref 8.9–10.3)
Chloride: 103 mmol/L (ref 98–111)
Creatinine, Ser: 0.99 mg/dL (ref 0.61–1.24)
GFR, Estimated: >60 mL/min (ref >60)
Glucose, Bld: 123 mg/dL — ABNORMAL HIGH (ref 70–99)
Potassium: 4.1 mmol/L (ref 3.5–5.1)
Sodium: 137 mmol/L (ref 135–145)

## 2022-12-23 LAB — CBC
HCT: 37.9 % — ABNORMAL LOW (ref 39.0–52.0)
Hemoglobin: 12.6 g/dL — ABNORMAL LOW (ref 13.0–17.0)
MCH: 33.1 pg (ref 26.0–34.0)
MCHC: 33.2 g/dL (ref 30.0–36.0)
MCV: 99.5 fL (ref 80.0–100.0)
Platelets: 239 10*3/uL (ref 150–400)
RBC: 3.81 MIL/uL — ABNORMAL LOW (ref 4.22–5.81)
RDW: 13.8 % (ref 11.5–15.5)
WBC: 11.2 10*3/uL — ABNORMAL HIGH (ref 4.0–10.5)
nRBC: 0 % (ref 0.0–0.2)

## 2022-12-23 LAB — MAGNESIUM: Magnesium: 2.1 mg/dL (ref 1.7–2.4)

## 2022-12-23 LAB — PHOSPHORUS: Phosphorus: 4.7 mg/dL — ABNORMAL HIGH (ref 2.5–4.6)

## 2022-12-23 LAB — HEPARIN LEVEL (UNFRACTIONATED): Heparin Unfractionated: 0.35 IU/mL (ref 0.30–0.70)

## 2022-12-23 NOTE — Progress Notes (Signed)
    Subjective  -   Agitated overnight   Physical Exam:  Denies chest pain Edematous left leg which is perfused without signs of phlegmasia       Assessment/Plan:    DVT/PE: Plan is for a conference with IR, CCM, and vascular surgery tomorrow to determine next steps.  He has a heavy clot burden in his pulmonary artery and so without addressing that, it places him at higher risk for pulmonary compromise during lower extremity venous thrombectomy should he have dislodgment of thrombus during the procedure.  In the meantime, he should continue on IV heparin and supplemental oxygen.  I discussed this with the patient's father at the bedside.  Jeremy [name] 12/23/2022 9:18 AM --  Vitals:   12/23/22 0754 12/23/22 0800  BP:  (!) 104/57  Pulse:  67  Resp:  18  Temp: (!) 97.3 F (36.3 C)   SpO2:  91%    Intake/Output Summary (Last 24 hours) at 12/23/2022 0918 Last data filed at 12/23/2022 0700 Gross per 24 hour  Intake 2831.99 ml  Output 1575 ml  Net 1256.99 ml     Laboratory CBC    Component Value Date/Time   WBC 11.2 (H) 12/23/2022 0729   HGB 12.6 (L) 12/23/2022 0729   HCT 37.9 (L) 12/23/2022 0729   PLT 239 12/23/2022 0729    BMET    Component Value Date/Time   NA 137 12/23/2022 0729   K 4.1 12/23/2022 0729   CL 103 12/23/2022 0729   CO2 26 12/23/2022 0729   GLUCOSE 123 (H) 12/23/2022 0729   BUN 11 12/23/2022 0729   CREATININE 0.99 12/23/2022 0729   CALCIUM 8.4 (L) 12/23/2022 0729   GFRNONAA >60 12/23/2022 0729   GFRAA >60 04/11/2020 0752    COAG No results found for: "INR", "PROTIME" No results found for: "PTT"  Antibiotics Anti-infectives (From admission, onward)    None        V. Leia Alf, M.D., [facility] [place] Office: [phone] Pager:  [phone]

## 2022-12-23 NOTE — Progress Notes (Signed)
ANTICOAGULATION CONSULT NOTE - Follow Up Consult  Pharmacy Consult for heparin Indication: PE, DVT   No Known Allergies  Patient Measurements: Height: 5\' 1"  (154.9 cm) Weight: 91.3 kg (201 lb 4.5 oz) (one blanket, two pillows) IBW/kg (Calculated) : 52.3 Heparin Dosing Weight: 72kg  Vital Signs: Temp: 98.3 F (36.8 C) (03/31 1141) Temp Source: Oral (03/31 1141) BP: 127/81 (03/31 1200) Pulse Rate: 103 (03/31 1200)  Labs: Recent Labs    12/09/2022 1342 11/23/2022 1414 11/24/2022 1415 12/12/2022 2113 11/29/2022 2113 12/22/22 0013 12/22/22 0421 12/22/22 0659 12/22/22 1435 12/23/22 0729  HGB 18.3* 18.7* 18.4*  --   --   --   --  14.4  --  12.6*  HCT 52.5* 55.0* 54.0*  --   --   --   --  43.3  --  37.9*  PLT 243  --   --   --   --   --   --  225  --  239  HEPARINUNFRC  --   --   --  0.19*   < >  --  0.26*  --  0.29* 0.35  CREATININE 1.46* 1.40*  --   --   --   --   --  1.23  --  0.99  TROPONINIHS 40*  --   --  25*  --  29*  --   --   --   --    < > = values in this interval not displayed.     Estimated Creatinine Clearance: 104.8 mL/min (by C-G formula based on SCr of 0.99 mg/dL).   Medical History: Past Medical History:  Diagnosis Date   ADHD (attention deficit hyperactivity disorder)    Down's syndrome     Assessment: 80 YOM presenting with foot pain, no pulses with doppler, he is not on anticoagulation PTA. Found to have extensive DVT and PE large clot burden w/ RH strain. Mild episodes of hypotension, MAP > 65.   Discussion ongoing for planning possible mechanical thrombectomy Heparin level therapeutic at 0.35 on heparin drip at 1650 uts/hr.   No issues w infusion noted, cbc stable no bleeding noted. Patient is a difficult stick. After discussion with MD, will adjust to 1700 units/hr and recheck in the AM.  Goal of Therapy:  Heparin level 0.3-0.7 units/ml Monitor platelets by anticoagulation protocol: Yes   Plan:  Increase heparin drip to 1700u/hr. Daily cbc and  heparin level  Monitor s/s bleeding   Thank you for involving pharmacy in this patient's care.  Reatha Harps, PharmD PGY2 Pharmacy Resident 12/23/2022 12:19 PM

## 2022-12-23 NOTE — Progress Notes (Signed)
   NAME:  Jeremy [name], MRN:  [id number], DOB:  07/03/1991, LOS: 2 ADMISSION DATE:  12/16/2022, CONSULTATION DATE:  12/23/22 REFERRING MD:  Zenia Resides, CHIEF COMPLAINT:  swollen leg   History of Present Illness:  Jeremy [name] is a 32 y.o. M with PMH significant for ADHD, OSA and Down's Syndrome who presented to the ED with one day of  L leg and foot pain, family noted the leg looking pale and blue so brought him to the ED.  No recent travel, surgeries or trauma.   His work-up in the ED was significant for acute  and extensive DVT of the L leg.  His oxygen saturations were in the low 90%'s and he required 2-3L [place] O2 but was not tachycardic or hypotensive.  CTA chest revealed acute PE with RV/LV ratio of 1.42. labs showed creatinine of 1.46, BNP 137,  troponin 40, WBC 12, lactic acid pending.  He was started on heparin and PCCM consulted. Jeremy [name] denies any chest pain or shortness of breath.   Pertinent  Medical History   has a past medical history of ADHD (attention deficit hyperactivity disorder) and Down's syndrome. OSA   Significant Hospital Events: Including procedures, antibiotic start and stop dates in addition to other pertinent events   3/29 presented to the ED with L swollen leg, found to have DVT and PE  Interim History / Subjective:  Started on precedex overnight for extreme agitation.  Currently resting with CPAP.  O2 needs mildly improved.  Dad/mom at bedside.  Objective   Blood pressure (!) 104/55, pulse 64, temperature (!) 97.3 F (36.3 C), temperature source Oral, resp. rate 18, height 5\' 1"  (1.549 m), weight 91.3 kg, SpO2 91 %.        Intake/Output Summary (Last 24 hours) at 12/23/2022 0820 Last data filed at 12/23/2022 0700 Gross per 24 hour  Intake 2831.99 ml  Output 1575 ml  Net 1256.99 ml    Filed Weights   12/09/2022 1314 12/22/22 0100 12/23/22 0500  Weight: 86.2 kg 88.1 kg 91.3 kg    Sleeping on BIPAP Heart sounds regular Ext warm Left leg looks better today  less blue more purple/red Moves all 4 ext when awoken  Labs pending (he refused them this am)  Resolved Hospital Problem list   AKI on CKD2  Assessment & Plan:  Submassive PE Threatened L phlegmasia cerulea dolens- looks a little better today Agitation- better with precedex, continue Hx OSA Trisomy 21 ADHD  See discussion yesterday. Continue heparin and tomorrow Drs. Clark (ICU), Carlis Abbott ([facility]), and Suttle (IR) will discuss timing of leg and pulmonary artery interventions, if needed.  Continue precedex for patient safety.  CPAP when sleeping  Wean O2 for sats > 90%   Best Practice (right click and "Reselect all SmartList Selections" daily)   Diet/type: Regular consistency (see orders) DVT prophylaxis: systemic heparin GI prophylaxis: N/A Lines: N/A Foley:  N/A Code Status:  full code Last date of multidisciplinary goals of care discussion [mother/father updated at bedside]   Erskine Emery MD PCCM

## 2022-12-23 NOTE — Plan of Care (Signed)
IR aware of request for PE lysis vs thrombectomy - per discussion with Dr. Kathlene Cote and Dr. Tamala Julian this weekend, plan for conservative management at this time as patient remains stable on IV heparin and room air. During workup patient found to have LLE DVT which [facility] is following for potential intervention, however they would prefer PE intervention before proceeding.  Plan for interdisciplinary meeting on 2023-01-21 with IR/CCM/[facility] to discuss possible PE intervention.   I have made patient NPO at midnight for potential procedure. Continue heparin gtt. IR APP will see for consult/consent if proceeding.  Candiss Norse, PA-C

## 2022-12-24 ENCOUNTER — Other Ambulatory Visit: Payer: Self-pay

## 2022-12-24 ENCOUNTER — Other Ambulatory Visit (HOSPITAL_COMMUNITY): Payer: Self-pay

## 2022-12-24 ENCOUNTER — Inpatient Hospital Stay (HOSPITAL_COMMUNITY): Payer: Medicaid Other

## 2022-12-24 ENCOUNTER — Inpatient Hospital Stay (HOSPITAL_COMMUNITY): Payer: Medicaid Other | Admitting: Anesthesiology

## 2022-12-24 ENCOUNTER — Encounter (HOSPITAL_COMMUNITY): Admission: EM | Disposition: E | Payer: Self-pay | Source: Home / Self Care | Attending: Internal Medicine

## 2022-12-24 ENCOUNTER — Encounter (HOSPITAL_COMMUNITY): Payer: Self-pay | Admitting: Pulmonary Disease

## 2022-12-24 DIAGNOSIS — Z9989 Dependence on other enabling machines and devices: Secondary | ICD-10-CM

## 2022-12-24 DIAGNOSIS — I82402 Acute embolism and thrombosis of unspecified deep veins of left lower extremity: Secondary | ICD-10-CM

## 2022-12-24 DIAGNOSIS — I2609 Other pulmonary embolism with acute cor pulmonale: Secondary | ICD-10-CM | POA: Diagnosis not present

## 2022-12-24 DIAGNOSIS — R451 Restlessness and agitation: Secondary | ICD-10-CM

## 2022-12-24 DIAGNOSIS — I82492 Acute embolism and thrombosis of other specified deep vein of left lower extremity: Secondary | ICD-10-CM | POA: Diagnosis not present

## 2022-12-24 DIAGNOSIS — G4733 Obstructive sleep apnea (adult) (pediatric): Secondary | ICD-10-CM

## 2022-12-24 DIAGNOSIS — I2699 Other pulmonary embolism without acute cor pulmonale: Secondary | ICD-10-CM | POA: Diagnosis not present

## 2022-12-24 DIAGNOSIS — J9601 Acute respiratory failure with hypoxia: Secondary | ICD-10-CM

## 2022-12-24 HISTORY — PX: IR THROMBECT PRIM MECH INIT (INCLU) MOD SED: IMG2297

## 2022-12-24 HISTORY — PX: IR US GUIDE VASC ACCESS RIGHT: IMG2390

## 2022-12-24 HISTORY — PX: IR US GUIDE VASC ACCESS LEFT: IMG2389

## 2022-12-24 HISTORY — PX: IR FLUORO GUIDE CV LINE LEFT: IMG2282

## 2022-12-24 HISTORY — PX: IR ANGIOGRAM SELECTIVE EACH ADDITIONAL VESSEL: IMG667

## 2022-12-24 HISTORY — PX: RADIOLOGY WITH ANESTHESIA: SHX6223

## 2022-12-24 HISTORY — PX: IR INFUSION THROMBOL ARTERIAL INITIAL (MS): IMG5376

## 2022-12-24 LAB — POCT I-STAT 7, (LYTES, BLD GAS, ICA,H+H)
Acid-base deficit: 7 mmol/L — ABNORMAL HIGH (ref 0.0–2.0)
Bicarbonate: 23.9 mmol/L (ref 20.0–28.0)
Calcium, Ion: 1.09 mmol/L — ABNORMAL LOW (ref 1.15–1.40)
HCT: 30 % — ABNORMAL LOW (ref 39.0–52.0)
Hemoglobin: 10.2 g/dL — ABNORMAL LOW (ref 13.0–17.0)
O2 Saturation: 81 %
Potassium: 5.3 mmol/L — ABNORMAL HIGH (ref 3.5–5.1)
Sodium: 135 mmol/L (ref 135–145)
TCO2: 27 mmol/L (ref 22–32)
pCO2 arterial: 86.8 mmHg (ref 32–48)
pH, Arterial: 7.048 — CL (ref 7.35–7.45)
pO2, Arterial: 67 mmHg — ABNORMAL LOW (ref 83–108)

## 2022-12-24 LAB — CBC
HCT: 36.7 % — ABNORMAL LOW (ref 39.0–52.0)
Hemoglobin: 12.2 g/dL — ABNORMAL LOW (ref 13.0–17.0)
MCH: 33.2 pg (ref 26.0–34.0)
MCHC: 33.2 g/dL (ref 30.0–36.0)
MCV: 100 fL (ref 80.0–100.0)
Platelets: 240 10*3/uL (ref 150–400)
RBC: 3.67 MIL/uL — ABNORMAL LOW (ref 4.22–5.81)
RDW: 13.6 % (ref 11.5–15.5)
WBC: 10.5 10*3/uL (ref 4.0–10.5)
nRBC: 0 % (ref 0.0–0.2)

## 2022-12-24 LAB — POCT ACTIVATED CLOTTING TIME: Activated Clotting Time: 163 s

## 2022-12-24 LAB — BASIC METABOLIC PANEL
Anion gap: 9 (ref 5–15)
BUN: 9 mg/dL (ref 6–20)
CO2: 27 mmol/L (ref 22–32)
Calcium: 8.2 mg/dL — ABNORMAL LOW (ref 8.9–10.3)
Chloride: 103 mmol/L (ref 98–111)
Creatinine, Ser: 0.83 mg/dL (ref 0.61–1.24)
GFR, Estimated: >60 mL/min (ref >60)
Glucose, Bld: 131 mg/dL — ABNORMAL HIGH (ref 70–99)
Potassium: 4.1 mmol/L (ref 3.5–5.1)
Sodium: 139 mmol/L (ref 135–145)

## 2022-12-24 LAB — POCT I-STAT, CHEM 8
BUN: 8 mg/dL (ref 6–20)
Calcium, Ion: 1.83 mmol/L (ref 1.15–1.40)
Chloride: 99 mmol/L (ref 98–111)
Creatinine, Ser: 1 mg/dL (ref 0.61–1.24)
Glucose, Bld: 320 mg/dL — ABNORMAL HIGH (ref 70–99)
HCT: 22 % — ABNORMAL LOW (ref 39.0–52.0)
Hemoglobin: 7.5 g/dL — ABNORMAL LOW (ref 13.0–17.0)
Potassium: 3.8 mmol/L (ref 3.5–5.1)
Sodium: 141 mmol/L (ref 135–145)
TCO2: 32 mmol/L (ref 22–32)

## 2022-12-24 LAB — HEMOGLOBIN A1C
Hgb A1c MFr Bld: 6.1 % — ABNORMAL HIGH (ref 4.8–5.6)
Mean Plasma Glucose: 128 mg/dL

## 2022-12-24 LAB — PHOSPHORUS: Phosphorus: 5.2 mg/dL — ABNORMAL HIGH (ref 2.5–4.6)

## 2022-12-24 LAB — MAGNESIUM: Magnesium: 2.1 mg/dL (ref 1.7–2.4)

## 2022-12-24 LAB — HEPARIN LEVEL (UNFRACTIONATED): Heparin Unfractionated: 0.27 IU/mL — ABNORMAL LOW (ref 0.30–0.70)

## 2022-12-24 SURGERY — IR WITH ANESTHESIA
Anesthesia: General

## 2022-12-24 MED ORDER — LIDOCAINE-EPINEPHRINE 1 %-1:100000 IJ SOLN
INTRAMUSCULAR | Status: AC
  Filled 2022-12-24: qty 1

## 2022-12-24 MED ORDER — ONDANSETRON HCL 4 MG/2ML IJ SOLN
INTRAMUSCULAR | Status: DC | PRN
  Administered 2022-12-24: 4 mg via INTRAVENOUS

## 2022-12-24 MED ORDER — SODIUM BICARBONATE 8.4 % IV SOLN
INTRAVENOUS | Status: DC | PRN
  Administered 2022-12-24 (×7): 50 meq via INTRAVENOUS

## 2022-12-24 MED ORDER — SODIUM CHLORIDE 0.9 % IV SOLN: INTRAVENOUS | Status: DC

## 2022-12-24 MED ORDER — GLYCOPYRROLATE PF 0.2 MG/ML IJ SOSY
PREFILLED_SYRINGE | INTRAMUSCULAR | Status: DC | PRN
  Administered 2022-12-24: .2 mg via INTRAVENOUS

## 2022-12-24 MED ORDER — SODIUM CHLORIDE 0.9% FLUSH: 3.0000 mL | Freq: Two times a day (BID) | INTRAVENOUS | Status: DC

## 2022-12-24 MED ORDER — ENOXAPARIN SODIUM 100 MG/ML IJ SOSY
90.0000 mg | PREFILLED_SYRINGE | Freq: Once | INTRAMUSCULAR | Status: DC
  Filled 2022-12-24: qty 0.9

## 2022-12-24 MED ORDER — IOHEXOL 300 MG/ML  SOLN
100.0000 mL | Freq: Once | INTRAMUSCULAR | Status: AC | PRN
  Administered 2022-12-24: 40 mL via INTRAVENOUS

## 2022-12-24 MED ORDER — ROCURONIUM BROMIDE 10 MG/ML (PF) SYRINGE
PREFILLED_SYRINGE | INTRAVENOUS | Status: DC | PRN
  Administered 2022-12-24 (×2): 50 mg via INTRAVENOUS

## 2022-12-24 MED ORDER — NOREPINEPHRINE 4 MG/250ML-% IV SOLN
INTRAVENOUS | Status: DC | PRN
  Administered 2022-12-24: 4 ug/min via INTRAVENOUS

## 2022-12-24 MED ORDER — ALTEPLASE 2 MG IJ SOLR
INTRAMUSCULAR | Status: DC | PRN
  Administered 2022-12-24: 8 mg

## 2022-12-24 MED ORDER — VASOPRESSIN 20 UNITS/100 ML INFUSION FOR SHOCK
0.0400 [IU]/min | INTRAVENOUS | Status: DC
  Administered 2022-12-24: .04 [IU]/min via INTRAVENOUS
  Filled 2022-12-24: qty 100

## 2022-12-24 MED ORDER — ENOXAPARIN (LOVENOX) PATIENT EDUCATION KIT
PACK | Freq: Once | Status: DC
  Filled 2022-12-24: qty 1

## 2022-12-24 MED ORDER — EPHEDRINE SULFATE-NACL 50-0.9 MG/10ML-% IV SOSY
PREFILLED_SYRINGE | INTRAVENOUS | Status: DC | PRN
  Administered 2022-12-24: 10 mg via INTRAVENOUS

## 2022-12-24 MED ORDER — CALCIUM CHLORIDE 10 % IV SOLN
INTRAVENOUS | Status: DC | PRN
  Administered 2022-12-24 (×4): 1 g via INTRAVENOUS

## 2022-12-24 MED ORDER — MIDAZOLAM HCL 2 MG/2ML IJ SOLN: 0.5000 mg | Freq: Once | INTRAMUSCULAR | Status: DC | PRN

## 2022-12-24 MED ORDER — ALBUMIN HUMAN 5 % IV SOLN: INTRAVENOUS | Status: DC | PRN

## 2022-12-24 MED ORDER — ORAL CARE MOUTH RINSE: 15.0000 mL | Freq: Once | OROMUCOSAL | Status: AC

## 2022-12-24 MED ORDER — ALTEPLASE 2 MG IJ SOLR
INTRAMUSCULAR | Status: AC
  Filled 2022-12-24: qty 8

## 2022-12-24 MED ORDER — ENOXAPARIN SODIUM 100 MG/ML IJ SOSY
1.0000 mg/kg | PREFILLED_SYRINGE | Freq: Once | INTRAMUSCULAR | Status: DC
  Administered 2022-12-24: 92.5 mg via SUBCUTANEOUS

## 2022-12-24 MED ORDER — ENOXAPARIN SODIUM 100 MG/ML IJ SOSY
90.0000 mg | PREFILLED_SYRINGE | Freq: Two times a day (BID) | INTRAMUSCULAR | Status: DC
  Filled 2022-12-24: qty 0.9

## 2022-12-24 MED ORDER — DEXMEDETOMIDINE HCL IN NACL 80 MCG/20ML IV SOLN
INTRAVENOUS | Status: DC | PRN
  Administered 2022-12-24 (×3): 8 ug via BUCCAL

## 2022-12-24 MED ORDER — ENOXAPARIN SODIUM 100 MG/ML IJ SOSY: 1.0000 mg/kg | PREFILLED_SYRINGE | Freq: Two times a day (BID) | INTRAMUSCULAR | Status: DC

## 2022-12-24 MED ORDER — HEPARIN SODIUM (PORCINE) 1000 UNIT/ML IJ SOLN
INTRAMUSCULAR | Status: DC | PRN
  Administered 2022-12-24: 8000 [IU] via INTRAVENOUS

## 2022-12-24 MED ORDER — MIDAZOLAM HCL 2 MG/2ML IJ SOLN
INTRAMUSCULAR | Status: AC
  Filled 2022-12-24: qty 2

## 2022-12-24 MED ORDER — EPINEPHRINE PF 1 MG/ML IJ SOLN
INTRAMUSCULAR | Status: DC | PRN
  Administered 2022-12-24 (×12): 1 mg via INTRAVENOUS

## 2022-12-24 MED ORDER — LACTATED RINGERS IV SOLN: INTRAVENOUS | Status: DC | PRN

## 2022-12-24 MED ORDER — PHENYLEPHRINE HCL-NACL 20-0.9 MG/250ML-% IV SOLN
INTRAVENOUS | Status: DC | PRN
  Administered 2022-12-24: 30 ug/min via INTRAVENOUS

## 2022-12-24 MED ORDER — SODIUM CHLORIDE 0.9 % IV SOLN
12.0000 mg | Freq: Once | INTRAVENOUS | Status: DC
  Administered 2022-12-24: 12 mg via INTRAVENOUS
  Filled 2022-12-24: qty 12

## 2022-12-24 MED ORDER — SUCCINYLCHOLINE CHLORIDE 200 MG/10ML IV SOSY
PREFILLED_SYRINGE | INTRAVENOUS | Status: DC | PRN
  Administered 2022-12-24: 100 mg via INTRAVENOUS

## 2022-12-24 MED ORDER — IOHEXOL 350 MG/ML SOLN
75.0000 mL | Freq: Once | INTRAVENOUS | Status: AC | PRN
  Administered 2022-12-24: 75 mL via INTRAVENOUS

## 2022-12-24 MED ORDER — SODIUM CHLORIDE 0.9% FLUSH: 3.0000 mL | INTRAVENOUS | Status: DC | PRN

## 2022-12-24 MED ORDER — SODIUM CHLORIDE 0.9 % IV SOLN: 250.0000 mL | INTRAVENOUS | Status: DC | PRN

## 2022-12-24 MED ORDER — PROPOFOL 500 MG/50ML IV EMUL
INTRAVENOUS | Status: DC | PRN
  Administered 2022-12-24: 40 ug/kg/min via INTRAVENOUS
  Administered 2022-12-24: 75 ug/kg/min via INTRAVENOUS

## 2022-12-24 MED ORDER — FENTANYL CITRATE (PF) 250 MCG/5ML IJ SOLN
INTRAMUSCULAR | Status: AC
  Filled 2022-12-24: qty 5

## 2022-12-24 MED ORDER — CHLORHEXIDINE GLUCONATE 0.12 % MT SOLN
15.0000 mL | Freq: Once | OROMUCOSAL | Status: AC
  Administered 2022-12-24: 15 mL via OROMUCOSAL

## 2022-12-24 MED ORDER — VASOPRESSIN 20 UNIT/ML IV SOLN
INTRAVENOUS | Status: DC | PRN
  Administered 2022-12-24: 40 [IU] via INTRAVENOUS
  Administered 2022-12-24 (×5): 20 [IU] via INTRAVENOUS

## 2022-12-24 MED ORDER — LACTATED RINGERS IV SOLN: INTRAVENOUS | Status: DC

## 2022-12-24 MED ORDER — TENECTEPLASE 50 MG IV KIT
50.0000 mg | PACK | Freq: Once | INTRAVENOUS | Status: DC
  Filled 2022-12-24: qty 10

## 2022-12-24 MED ORDER — SODIUM CHLORIDE 0.9% IV SOLUTION: Freq: Once | INTRAVENOUS | Status: DC

## 2022-12-24 MED ORDER — EPINEPHRINE HCL 5 MG/250ML IV SOLN IN NS
INTRAVENOUS | Status: DC | PRN
  Administered 2022-12-24: 10 ug/min via INTRAVENOUS

## 2022-12-24 MED ORDER — MILRINONE LACTATE IN DEXTROSE 20-5 MG/100ML-% IV SOLN
INTRAVENOUS | Status: DC | PRN
  Administered 2022-12-24: .375 ug/kg/min via INTRAVENOUS

## 2022-12-24 MED ORDER — FENTANYL CITRATE (PF) 250 MCG/5ML IJ SOLN
INTRAMUSCULAR | Status: DC | PRN
  Administered 2022-12-24: 25 ug via INTRAVENOUS

## 2022-12-24 MED ORDER — MIDAZOLAM HCL 2 MG/2ML IJ SOLN
INTRAMUSCULAR | Status: DC | PRN
  Administered 2022-12-24 (×2): 1 mg via INTRAVENOUS

## 2022-12-24 DEATH — deceased

## 2022-12-26 LAB — TYPE AND SCREEN
Unit division: 0
Unit division: 0

## 2022-12-26 LAB — BPAM RBC
Blood Product Expiration Date: 202404252359
Blood Product Expiration Date: 202404252359
ISSUE DATE / TIME: 202404012314
ISSUE DATE / TIME: 202404020349
Unit Type and Rh: 5100
Unit Type and Rh: 5100

## 2022-12-28 NOTE — Addendum Note (Signed)
Addendum  created 12/28/22 [id number] by Tressia Miners, CRNA   Intraprocedure Meds edited

## 2023-01-22 MED FILL — Medication: Qty: 1 | Status: AC

## 2023-01-23 NOTE — Sedation Documentation (Signed)
Emergency consent for PE lysis catheters for TPA infusion

## 2023-01-23 NOTE — Sedation Documentation (Signed)
Recheck of current PA pressure post clot removal. 68/20 (35)

## 2023-01-23 NOTE — Progress Notes (Addendum)
ANTICOAGULATION CONSULT NOTE - Follow Up Consult  Pharmacy Consult for heparin>Lovenox Indication: PE, DVT   No Known Allergies  Patient Measurements: Height: 5\' 1"  (154.9 cm) Weight: 91.3 kg (201 lb 4.5 oz) (one blanket, two pillows) IBW/kg (Calculated) : 52.3 Heparin Dosing Weight: 72kg  Vital Signs: Temp: 97.5 F (36.4 C) 01-11-23 1215) Temp Source: Oral 01/11/2023 1215) BP: 113/72 01-11-23 1215) Pulse Rate: 76 01-11-23 1215)  Labs: Recent Labs    12/13/2022 2113 12/22/22 0013 12/22/22 0421 12/22/22 0659 12/22/22 1435 12/23/22 0729 01/11/2023 0459  HGB  --   --    < > 14.4  --  12.6* 12.2*  HCT  --   --   --  43.3  --  37.9* 36.7*  PLT  --   --   --  225  --  239 240  HEPARINUNFRC 0.19*  --    < >  --  0.29* 0.35 0.27*  CREATININE  --   --   --  1.23  --  0.99 0.83  TROPONINIHS 25* 29*  --   --   --   --   --    < > = values in this interval not displayed.     Estimated Creatinine Clearance: 125 mL/min (by C-G formula based on SCr of 0.83 mg/dL).   Medical History: Past Medical History:  Diagnosis Date   ADHD (attention deficit hyperactivity disorder)    Down's syndrome    Pneumonia     Assessment: 31 YOM presenting with foot pain, no pulses with doppler, he is not on anticoagulation PTA. Found to have extensive DVT and PE large clot burden w/ RH strain. Pt started on IV heparin per pharmacy.  Heparin level slightly below goal at 0.27, CBC stable. Possible mechanical thrombectomy today. No bleeding issues.  Pt is s/p thrombectomy. The first dose of Lovenox was given in IR. Lovenox will be continued until ready for NOAC. Pt is on tPA infusion with plan to take cath out in AM. Dr Serafina Royals said to continue Lovenox.    Goal of Therapy:  Anti-Xa 0.6-1 Monitor platelets by anticoagulation protocol: Yes   Plan:  Dc heparin Lovenox 90mg  SQ BID F/u [place], PharmD, BCIDP, AAHIVP, CPP Infectious Disease Pharmacist 01/11/23 5:47 PM

## 2023-01-23 NOTE — Progress Notes (Signed)
Pt refused CPAP use for tonight, RN made aware. Pt resting comfortably on nasal cannula at this time.

## 2023-01-23 NOTE — Procedures (Signed)
Interventional Radiology Procedure Note  Procedure:  1) Pulmonary angiogram 2) Pulmonary arterial thrombectomy 3) Left lower extremity venogram 4) Aspiration and pharmacomechanical thrombectomy of left external iliac and common femoral veins 5) Balloon angioplasty of left external iliac and common femoral veins  Findings: Please refer to procedural dictation for full description.    Left PE thrombectomy with decrease in PA pressure from 77/40 (mean 55) mmHg to 68/20 (mean 35) mmHg.    Left lower extremity thrombectomy with removal of subacute thrombus.    R greater saphenous vein access, 16 Fr, perclose x2.   Complications: None immediate  Estimated Blood Loss: 200 mL  Recommendations: No head of bed restrictions. Encourage ambulation. 1 mg/kg Lovenox administered in IR, continue Q12 hrs.  Heparin gtt discontinued.   LLE TED hose and SCD placed.  Keep on at all times unless ambulating. IR will follow.   Ruthann Cancer, MD

## 2023-01-23 NOTE — TOC Benefit Eligibility Note (Signed)
Patient Teacher, English as a foreign language completed.    The patient is currently admitted and upon discharge could be taking Eliquis 5 mg.  The current 30 day co-pay is $0.00.   The patient is currently admitted and upon discharge could be taking Xarelto 20 mg.  The current 30 day co-pay is $0.00.   The patient is currently admitted and upon discharge could be taking Pradaxa 150 mg.  The current 30 day co-pay is $0.00.   The patient is insured through [facility] Medicaid   This test claim was processed through [facility] amounts may vary at other pharmacies due to pharmacy/plan contracts, or as the patient moves through the different stages of their insurance plan.  Lyndel Safe, [place] Patient Advocate Specialist [place] Patient Advocate Team Direct Number: [phone]  Fax: [phone]

## 2023-01-23 NOTE — Sedation Documentation (Signed)
Code called after allowing family to enter IR suite to see patient.  Patient family given respectful space and time.

## 2023-01-23 NOTE — Consult Note (Signed)
Chief Complaint: Patient was seen in consultation today for pulmonary embolism, deep vein thrombosis   Referring Physician(s): Dr. Carlis Abbott   Supervising Physician: Ruthann Cancer  Patient Status: [facility] - In-pt  History of Present Illness: Jeremy [name] is a 32 y.o. male with a medical history significant for ADHD, obstructive sleep apnea and Down's syndrome. He presented to the [facility] ED 12/22/22 with one day of left leg and foot pain. Work up in the ED was positive for acute and extensive DVT of the left leg. His oxygen saturations were in the low 90's and a CTA revealed an acute PE with right heart strain. He was started on heparin.   Interventional Radiology was asked to evaluate this patient for an image-guided pulmonary embolism thrombectomy and left lower extremity venogram with possible intervention. New CTA ordered to assess for possible changes in clot burden.   CTA Chest PE 12/31/22 IMPRESSION: 1. Large bilateral pulmonary emboli with right heart strain again noted. 2. Increasing bilateral pleural effusions. 3. Diffuse ground-glass opacity consistent with pneumonitis, edema or hemorrhage. 4. Stable mediastinal adenopathy.  Today's CTA is essentially unchanged from prior imaging and after discussions with Pulmonary Critical Care and Vascular Surgery, IR will proceed with PE thrombectomy, left lower extremity venogram with thrombectomy, possible angioplasty and possible stent placement.   Past Medical History:  Diagnosis Date   ADHD (attention deficit hyperactivity disorder)    Down's syndrome     Past Surgical History:  Procedure Laterality Date   Left testicular surger     Miringotomy with tubes     TONSILLECTOMY      Allergies: Patient has no known allergies.  Medications: Prior to Admission medications   Medication Sig Start Date End Date Taking? Authorizing Provider  atomoxetine (STRATTERA) 60 MG capsule Take 60 mg by mouth daily.   Yes [provider]  cloNIDine (CATAPRES) 0.1 MG tablet Take 0.1 mg by mouth every evening.  04/02/20  Yes [provider]  guanFACINE (INTUNIV) 4 MG TB24 ER tablet Take 4 mg by mouth every morning. 02/02/20  Yes [provider]  loratadine (CLARITIN) 10 MG tablet Take 10 mg by mouth daily.   Yes [provider]  methylphenidate 54 MG PO CR tablet Take 108 mg by mouth every morning.   Yes [provider]  OLANZapine (ZYPREXA) 5 MG tablet Take 5 mg by mouth at bedtime.   Yes [provider]     History reviewed. No pertinent family history.  Social History   Socioeconomic History   Marital status: Married    Spouse name: Not on file   Number of children: Not on file   Years of education: Not on file   Highest education level: Not on file  Occupational History   Not on file  Tobacco Use   Smoking status: Never   Smokeless tobacco: Never  Substance and Sexual Activity   Alcohol use: No   Drug use: No   Sexual activity: Not on file  Other Topics Concern   Not on file  Social History Narrative   Not on file   Social Determinants of Health   Financial Resource Strain: Not on file  Food Insecurity: Not on file  Transportation Needs: Not on file  Physical Activity: Not on file  Stress: Not on file  Social Connections: Not on file    Review of Systems: A 12 point ROS discussed and pertinent positives are indicated in the HPI above.  All other  systems are negative.  Review of Systems  Unable to perform ROS: Other    Vital Signs: BP 110/64   Pulse 76   Temp 98.2 F (36.8 C) (Oral)   Resp 16   Ht 5\' 1"  (1.549 m)   Wt 201 lb 4.5 oz (91.3 kg) Comment: one blanket, two pillows  SpO2 93%   BMI 38.03 kg/m   Physical Exam Constitutional:      General: He is not in acute distress.    Appearance: He is not ill-appearing.     Comments: Patient asleep. Precedex  infusion.   HENT:     Mouth/Throat:     Mouth: Mucous membranes are  moist.     Pharynx: Oropharynx is clear.  Cardiovascular:     Rate and Rhythm: Normal rate and regular rhythm.     Pulses: Normal pulses.     Heart sounds: Normal heart sounds.  Pulmonary:     Effort: Pulmonary effort is normal.     Breath sounds: Normal breath sounds.  Abdominal:     General: Bowel sounds are normal.     Palpations: Abdomen is soft.  Musculoskeletal:     Right lower leg: No edema.     Left lower leg: Edema present.  Skin:    General: Skin is warm and dry.  Neurological:     Comments: Unable to determine     Imaging: ECHOCARDIOGRAM COMPLETE  Result Date: 12/22/2022    ECHOCARDIOGRAM REPORT   Patient Name:   Jeremy [name] Date of Exam: 12/22/2022 Medical Rec #:  [id number]           Height:       61.0 in Accession #:    [id number]          Weight:       194.2 lb Date of Birth:  [birth year]           BSA:          1.865 m Patient Age:    31 years            BP:           84/62 mmHg Patient Gender: M                   HR:           91 bpm. Exam Location:  Inpatient Procedure: 2D Echo, Cardiac Doppler and Color Doppler Indications:    Pulmonary Embolus I26.09  History:        Patient has no prior history of Echocardiogram examinations.                 Pulmonary Emboulus; Lower extremity DVT,                 Signs/Symptoms:Shortness of Breath; Risk Factors:Non-Smoker.  Sonographer:    Wilkie Aye RVT RCS Referring Phys: [id number] Collier Bullock  Sonographer Comments: Technically challenging study due to limited acoustic windows, Technically difficult study due to poor echo windows, suboptimal apical window, suboptimal subcostal window, suboptimal parasternal window and patient is obese. Image acquisition challenging due to patient body habitus, Image acquisition challenging due to uncooperative patient and Image acquisition challenging due to respiratory motion. Unable to administer Definity due to uncooperative patient and refuses breath hold. IMPRESSIONS  1. Left ventricular  ejection fraction, by estimation, is 60 to 65%. The left ventricle has normal function. Left ventricular endocardial border not optimally defined to evaluate regional wall motion. Left ventricular diastolic  parameters were normal.  2. Ventricular septum is flattened in systole suggesting RV pressure overload. . Right ventricular systolic function is normal. The right ventricular size is mildly enlarged. Tricuspid regurgitation signal is inadequate for assessing PA pressure.  3. The mitral valve is normal in structure. No evidence of mitral valve regurgitation. No evidence of mitral stenosis.  4. The aortic valve has an indeterminant number of cusps. Aortic valve regurgitation is not visualized. No aortic stenosis is present. FINDINGS  Left Ventricle: Left ventricular ejection fraction, by estimation, is 60 to 65%. The left ventricle has normal function. Left ventricular endocardial border not optimally defined to evaluate regional wall motion. The left ventricular internal cavity size was normal in size. There is no left ventricular hypertrophy. Left ventricular diastolic parameters were normal. Right Ventricle: Ventricular septum is flattened in systole suggesting RV pressure overload. The right ventricular size is mildly enlarged. Right vetricular wall thickness was not well visualized. Right ventricular systolic function is normal. Tricuspid regurgitation signal is inadequate for assessing PA pressure. Left Atrium: Left atrial size was normal in size. Right Atrium: Right atrial size was not well visualized. Pericardium: There is no evidence of pericardial effusion. Mitral Valve: The mitral valve is normal in structure. No evidence of mitral valve regurgitation. No evidence of mitral valve stenosis. Tricuspid Valve: The tricuspid valve is normal in structure. Tricuspid valve regurgitation is not demonstrated. No evidence of tricuspid stenosis. Aortic Valve: The aortic valve has an indeterminant number of cusps.  Aortic valve regurgitation is not visualized. No aortic stenosis is present. Aortic valve mean gradient measures 3.0 mmHg. Aortic valve peak gradient measures 5.4 mmHg. Aortic valve area, by VTI measures 2.36 cm. Pulmonic Valve: The pulmonic valve was not well visualized. Pulmonic valve regurgitation is not visualized. No evidence of pulmonic stenosis. Aorta: The aortic root is normal in size and structure. IAS/Shunts: No atrial level shunt detected by color flow Doppler.  LEFT VENTRICLE PLAX 2D LVIDd:         4.10 cm   Diastology LVIDs:         2.40 cm   LV e' medial:    8.65 cm/s LV PW:         1.00 cm   LV E/e' medial:  8.8 LV IVS:        0.70 cm   LV e' lateral:   14.20 cm/s LVOT diam:     1.70 cm   LV E/e' lateral: 5.4 LV SV:         40 LV SV Index:   22 LVOT Area:     2.27 cm  RIGHT VENTRICLE             IVC RV Basal diam:  3.70 cm     IVC diam: 1.40 cm RV S prime:     17.10 cm/s TAPSE (M-mode): 2.6 cm LEFT ATRIUM           Index        RIGHT ATRIUM           Index LA diam:      2.50 cm 1.34 cm/m   RA Area:     11.70 cm LA Vol (A2C): 10.5 ml 5.63 ml/m   RA Volume:   26.80 ml  14.37 ml/m LA Vol (A4C): 20.1 ml 10.78 ml/m  AORTIC VALVE                    PULMONIC VALVE AV Area (Vmax):    2.17 cm  PV Vmax:       0.96 m/s AV Area (Vmean):   2.14 cm     PV Peak grad:  3.7 mmHg AV Area (VTI):     2.36 cm AV Vmax:           116.00 cm/s AV Vmean:          73.100 cm/s AV VTI:            0.171 m AV Peak Grad:      5.4 mmHg AV Mean Grad:      3.0 mmHg LVOT Vmax:         111.00 cm/s LVOT Vmean:        68.800 cm/s LVOT VTI:          0.178 m LVOT/AV VTI ratio: 1.04  AORTA Ao Root diam: 2.30 cm Ao Asc diam:  2.20 cm MITRAL VALVE MV Area (PHT): 3.89 cm    SHUNTS MV Decel Time: 195 msec    Systemic VTI:  0.18 m MV E velocity: 76.40 cm/s  Systemic Diam: 1.70 cm MV A velocity: 68.30 cm/s MV E/A ratio:  1.12 Carlyle Dolly MD Electronically signed by Carlyle Dolly MD Signature Date/Time: 12/22/2022/9:47:50 AM     Final    VAS [place] LOWER EXTREMITY VENOUS (DVT) (7a-7p)  Result Date: 11/28/2022  Lower Venous DVT Study Patient Name:  Jeremy [name]  Date of Exam:   12/09/2022 Medical Rec #: [id number]            Accession #:    [id number] Date of Birth: 1991-04-25            Patient Gender: M Patient Age:   31 years Exam Location:  [facility] Procedure:      VAS [place] LOWER EXTREMITY VENOUS (DVT) Referring Phys: Garnette Gunner --------------------------------------------------------------------------------  Indications: Pain, Swelling, and discoloration.  Limitations: Body habitus and patient discomfort. Comparison Study: No previous exams Performing Technologist: Jody Hill RVT, RDMS  Examination Guidelines: A complete evaluation includes B-mode imaging, spectral Doppler, color Doppler, and power Doppler as needed of all accessible portions of each vessel. Bilateral testing is considered an integral part of a complete examination. Limited examinations for reoccurring indications may be performed as noted. The reflux portion of the exam is performed with the patient in reverse Trendelenburg.  +-----+---------------+---------+-----------+----------+--------------+ RIGHTCompressibilityPhasicitySpontaneityPropertiesThrombus Aging +-----+---------------+---------+-----------+----------+--------------+ CFV  Full           Yes      Yes                                 +-----+---------------+---------+-----------+----------+--------------+   +---------+---------------+---------+-----------+----------+--------------+ LEFT     CompressibilityPhasicitySpontaneityPropertiesThrombus Aging +---------+---------------+---------+-----------+----------+--------------+ CFV      None           No       No                   Acute          +---------+---------------+---------+-----------+----------+--------------+ SFJ      None                                         Acute           +---------+---------------+---------+-----------+----------+--------------+ FV Prox  None           No  Yes                  Acute          +---------+---------------+---------+-----------+----------+--------------+ FV Mid   None           No       No                   Acute          +---------+---------------+---------+-----------+----------+--------------+ FV DistalNone           No       No                   Acute          +---------+---------------+---------+-----------+----------+--------------+ PFV      None           No       No                   Acute          +---------+---------------+---------+-----------+----------+--------------+ POP      None           No       No                   Acute          +---------+---------------+---------+-----------+----------+--------------+ PTV      None           No       No                   Acute          +---------+---------------+---------+-----------+----------+--------------+ PERO     None           No       No                   Acute          +---------+---------------+---------+-----------+----------+--------------+ GSV      None           No       No                   Acute          +---------+---------------+---------+-----------+----------+--------------+ EIV      None           No       No                   Acute          +---------+---------------+---------+-----------+----------+--------------+ Attempted to look and IVC / common iliac but unable to see due to habitus and patient discomfort    Summary: RIGHT: - No evidence of common femoral vein obstruction. - No cystic structure found in the popliteal fossa.  LEFT: - Findings consistent with acute deep vein thrombosis involving the left common femoral vein, SF junction, left femoral vein, left proximal profunda vein, left popliteal vein, left posterior tibial veins, and left peroneal veins. - Findings consistent with acute superficial vein  thrombosis involving the left great saphenous vein.  *See table(s) above for measurements and observations.  Suggest Peripheral Vascular Consult. Electronically signed by Harold Barban MD on 11/27/2022 at 8:08:43 PM.    Final    CT VENOGRAM ABDOMEN PELVIS  Result Date: 12/20/2022 CLINICAL DATA:  Left lower extremity swelling and cyanosis EXAM: CT VENOGRAM ABDOMEN AND PELVIS TECHNIQUE: Venographic  phase images of the abdomen and pelvis were obtained following the administration of intravenous contrast. Multiplanar reformats and maximum intensity projections were generated. RADIATION DOSE REDUCTION: This exam was performed according to the departmental dose-optimization program which includes automated exposure control, adjustment of the mA and/or kV according to patient size and/or use of iterative reconstruction technique. CONTRAST:  50mL OMNIPAQUE IOHEXOL 350 MG/ML SOLN COMPARISON:  None Available. FINDINGS: VASCULAR Arteries: No systemic arterial abnormalities are seen on this examination tailored for the evaluation of the venous structures. Bilateral filling defects are seen within the pulmonary arteries, consistent with the central and segmental pulmonary emboli seen on corresponding CT angiography of the chest. Veins: There is an acute deep venous thrombus within the left external iliac and visualized portions of the common femoral vein. Surrounding fat stranding consistent with acute thrombosis. No other deep venous thrombus is identified. IVC is patent. Portal vein, SMV, and splenic vein are widely patent. Review of the MIP images confirms the above findings. NON-VASCULAR Lower chest: Trace left pleural effusion. Hypoventilatory changes throughout the lungs. Trace pericardial effusion. Hepatobiliary: No focal liver abnormality is seen. No gallstones, gallbladder wall thickening, or biliary dilatation. Pancreas: Unremarkable. No pancreatic ductal dilatation or surrounding inflammatory changes. Spleen:  Normal in size without focal abnormality. Adrenals/Urinary Tract: The kidneys are unremarkable. Excreted contrast is seen within the kidneys and ureters, with no filling defects. Bladder is minimally distended, with mass effect upon the left lateral aspect of the bladder due to the acute left external iliac vein thrombus and associated fat stranding. No filling defect. The adrenals are unremarkable. Stomach/Bowel: No bowel obstruction or ileus. Normal appendix right lower quadrant. No bowel wall thickening or inflammatory change. Lymphatic: No pathologic adenopathy within the abdomen or pelvis. Reproductive: Prostate is unremarkable. Other: No free intraperitoneal fluid or free intraperitoneal gas. Extraperitoneal fat stranding along the course of the left external iliac vein due to acute DVT as described above. No abdominal wall hernia. Musculoskeletal: No acute or destructive bony lesions. Reconstructed images demonstrate no additional findings. IMPRESSION: 1. Acute deep venous thrombosis of the left common femoral and left external iliac vein, with surrounding reactive fat stranding. 2. Bilateral pulmonary emboli, please see corresponding CT angiography chest report. 3. Trace left pleural effusion.  Trace pericardial effusion. Critical Value/emergent results were called by telephone at the time of interpretation on 12/13/2022 at 7:50pm to provider DR Zenia Resides, who verbally acknowledged these results. Electronically Signed   By: Randa Ngo M.D.   On: 12/01/2022 19:58   CT Angio Chest PE W/Cm &/Or Wo Cm  Result Date: 11/30/2022 CLINICAL DATA:  Pulmonary embolism (PE) suspected, high prob, left lower extremity swelling and cyanosis EXAM: CT ANGIOGRAPHY CHEST WITH CONTRAST TECHNIQUE: Multidetector CT imaging of the chest was performed using the standard protocol during bolus administration of intravenous contrast. Multiplanar CT image reconstructions and MIPs were obtained to evaluate the vascular anatomy.  RADIATION DOSE REDUCTION: This exam was performed according to the departmental dose-optimization program which includes automated exposure control, adjustment of the mA and/or kV according to patient size and/or use of iterative reconstruction technique. CONTRAST:  19mL OMNIPAQUE IOHEXOL 350 MG/ML SOLN COMPARISON:  None Available. FINDINGS: Cardiovascular: This is a technically adequate evaluation of the pulmonary vasculature. There are bilateral central and segmental pulmonary emboli, left greater than right. Severe clot burden, with evidence of right heart strain with an elevated RV/LV ratio measuring 1.42. Trace pericardial effusion. No evidence of thoracic aortic aneurysm or dissection. Mediastinum/Nodes: Mediastinal adenopathy, with right paratracheal lymph  node measuring up to 10 mm reference image 23/5. Thyroid, trachea, and esophagus are unremarkable. Lungs/Pleura: Bilateral hypoventilatory changes. Trace left pleural effusion. No pneumothorax. Central airways are patent. Upper Abdomen: No acute abnormality. Musculoskeletal: No acute or destructive bony lesions. Reconstructed images demonstrate no additional findings. Review of the MIP images confirms the above findings. IMPRESSION: 1. Positive for acute PE with CT evidence of right heart strain (RV/LV Ratio = 1.42) consistent with at least submassive (intermediate risk) PE. The presence of right heart strain has been associated with an increased risk of morbidity and mortality. Please refer to the "Code PE Focused" order set in EPIC. 2. Trace pericardial effusion. 3. Trace left pleural effusion. 4. Mediastinal adenopathy, nonspecific. Critical Value/emergent results were called by telephone at the time of interpretation on 11/28/2022 at 7:50pm to provider DR Zenia Resides, who verbally acknowledged these results. Electronically Signed   By: Randa Ngo M.D.   On: 12/18/2022 19:57    Labs:  CBC: Recent Labs    11/23/2022 1342 12/15/2022 1414 12/07/2022 1415  12/22/22 0659 12/23/22 0729 01-09-2023 0459  WBC 12.1*  --   --  15.0* 11.2* 10.5  HGB 18.3*   < > 18.4* 14.4 12.6* 12.2*  HCT 52.5*   < > 54.0* 43.3 37.9* 36.7*  PLT 243  --   --  225 239 240   < > = values in this interval not displayed.    COAGS: No results for input(s): "INR", "APTT" in the last 8760 hours.  BMP: Recent Labs    12/08/2022 1342 11/30/2022 1414 12/13/2022 1415 12/22/22 0659 12/23/22 0729 2023-01-09 0459  NA 137 138 138 135 137 139  K 4.9 5.1 5.0 4.0 4.1 4.1  CL 99 102  --  101 103 103  CO2 23  --   --  24 26 27   GLUCOSE 150* 148*  --  137* 123* 131*  BUN 14 15  --  12 11 9   CALCIUM 9.2  --   --  8.5* 8.4* 8.2*  CREATININE 1.46* 1.40*  --  1.23 0.99 0.83  GFRNONAA >60  --   --  >60 >60 >60    LIVER FUNCTION TESTS: Recent Labs    11/30/2022 1342  BILITOT 1.0  AST 17  ALT 22  ALKPHOS 160*  PROT 8.7*  ALBUMIN 3.7    TUMOR MARKERS: No results for input(s): "AFPTM", "CEA", "CA199", "CHROMGRNA" in the last 8760 hours.  Assessment and Plan:  Pulmonary embolism; deep vein thrombosis: Jeremy [name], 32 year old male, is scheduled today for an image-guided pulmonary embolism thrombectomy and left lower extremity venogram with thrombectomy, possible angioplasty, possible stent placement. These procedures were discussed with the patient's mother, father and step-mother at the bedside. Consent has been signed by the patient's father. This procedure will be done under MAC anesthesia.   Risks and benefits of PE thrombectomy discussed with the patient including, but not limited to bleeding, pulmonary hemorrhage, perforation or dissection of cardiovascular structures, vascular injury, stroke, contrast induced renal failure, and infection.  The patient has been NPO. Consent signed and in the chart.   Thank you for this interesting consult.  I greatly enjoyed meeting Jeremy [name] and look forward to participating in their care.  A copy of this report was sent  to the requesting provider on this date.  Electronically Signed: Soyla Dryer, AGACNP-BC [phone] 09-Jan-2023, 8:15 AM   I spent a total of 20 Minutes    in face to face in clinical  consultation, greater than 50% of which was counseling/coordinating care for PE thrombectomy; left lower extremity venogram with possible intervention.

## 2023-01-23 NOTE — Anesthesia Procedure Notes (Signed)
Procedure Name: Intubation Date/Time: 01-05-23 4:58 PM  Performed by: Reece Agar, CRNAPre-anesthesia Checklist: Patient identified, Emergency Drugs available, Suction available and Patient being monitored Patient Re-evaluated:Patient Re-evaluated prior to induction Oxygen Delivery Method: Circle System Utilized Preoxygenation: Pre-oxygenation with 100% oxygen Induction Type: IV induction, Rapid sequence and Cricoid Pressure applied Ventilation: Mask ventilation without difficulty Laryngoscope Size: Glidescope and 4 Grade View: Grade I Tube type: Oral Number of attempts: 1 Airway Equipment and Method: Stylet and Oral airway Placement Confirmation: ETT inserted through vocal cords under direct vision, positive ETCO2 and breath sounds checked- equal and bilateral Secured at: 20 cm Tube secured with: Tape Dental Injury: Teeth and Oropharynx as per pre-operative assessment

## 2023-01-23 NOTE — Progress Notes (Signed)
NAME:  Jeremy [name], MRN:  [id number], DOB:  [birth year], LOS: 3 ADMISSION DATE:  11/29/2022, CONSULTATION DATE:  01-03-23 REFERRING MD:  Zenia Resides, CHIEF COMPLAINT:  swollen leg   History of Present Illness:  Jeremy [name] is a 32 y.o. M with PMH significant for ADHD, OSA and Down's Syndrome who presented to the ED with one day of  L leg and foot pain, family noted the leg looking pale and blue so brought him to the ED.  No recent travel, surgeries or trauma.   His work-up in the ED was significant for acute  and extensive DVT of the L leg.  His oxygen saturations were in the low 90%'s and he required 2-3L [place] O2 but was not tachycardic or hypotensive.  CTA chest revealed acute PE with RV/LV ratio of 1.42. labs showed creatinine of 1.46, BNP 137,  troponin 40, WBC 12, lactic acid pending.  He was started on heparin and PCCM consulted. Rodd denies any chest pain or shortness of breath.   Pertinent  Medical History   has a past medical history of ADHD (attention deficit hyperactivity disorder) and Down's syndrome. OSA   Significant Hospital Events: Including procedures, antibiotic start and stop dates in addition to other pertinent events   3/29 presented to the ED with L swollen leg, found to have DVT and PE  Interim History / Subjective:  Afebrile, labile BPs.  Per father, left leg looks much better today. He complains of leg pain.   Objective   Blood pressure 110/64, pulse 76, temperature 98.2 F (36.8 C), temperature source Oral, resp. rate 16, height 5\' 1"  (1.549 m), weight 91.3 kg, SpO2 93 %.        Intake/Output Summary (Last 24 hours) at January 03, 2023 0846 Last data filed at 01/03/2023 0800 Gross per 24 hour  Intake 2957.09 ml  Output 1140 ml  Net 1817.09 ml    Filed Weights   12/15/2022 1314 12/22/22 0100 12/23/22 0500  Weight: 86.2 kg 88.1 kg 91.3 kg    Sitting up in bed watching TV Bier/AT, eyes anicteric Breathing comfortably on , CTAB S1S2, RRR Abd nondistended Left  leg warm, skin discolored compared to the R. LLE edema. Awake, alert, answering questions appropriately.  Cooperative with exam.  BUN 9  Cr 0.83 WBC 10.5 H/H 12.2/ 36.7 Platelets 240  Echo: LVEF 60-65%, RV dilated with pressure overload, sonographically normal RV function.   Resolved Hospital Problem list   AKI on CKD2  Assessment & Plan:  Submassive PE Threatened L phlegmasia cerulea dolens- looks a little better today -con't heparin -CT chest to re-evaluate PE burden; planning for thrombectomy today.  Agitation -precedex PRN; will need for CT per parents -midazolam PRN to help with CT   Hx OSA -CPAP  Trisomy 21 ADHD -parents are helpful in redirecting him and calming him down  Both parents updated at bedside today. Con't NPO for procedure later today.   Best Practice (right click and "Reselect all SmartList Selections" daily)   Diet/type: Regular consistency (see orders) DVT prophylaxis: systemic heparin GI prophylaxis: N/A Lines: N/A Foley:  N/A Code Status:  full code Last date of multidisciplinary goals of care discussion [mother/father updated at bedside]  This patient is critically ill with multiple organ system failure which requires frequent high complexity decision making, assessment, support, evaluation, and titration of therapies. This was completed through the application of advanced monitoring technologies and extensive interpretation of multiple databases. During this encounter critical care time was devoted to  patient care services described in this note for 34 minutes.    Julian Hy, DO 23-Jan-2023 9:24 AM [facility]  For contact information, see Amion. If no response to pager, please call PCCM consult pager. After hours, 7PM- 7AM, please call Elink.

## 2023-01-23 NOTE — Anesthesia Postprocedure Evaluation (Signed)
Anesthesia Post Note  Patient: Jeremy [name]  Procedure(s) Performed: IR WITH ANESTHESIA     Anesthesia Type: General Anesthetic complications: no Comments: Pt expired in interventional radiology. Please see intra-op record for full account of events.  No notable events documented.  Last Vitals:  Vitals:   January 07, 2023 1200 January 07, 2023 1215  BP: 100/72 113/72  Pulse: 65 76  Resp: 12 20  Temp:  (!) 36.4 C  SpO2: 96% 99%    Last Pain:  Vitals:   2023/01/07 1215  TempSrc: Oral  PainSc: 0-No pain                 Tiajuana Amass

## 2023-01-23 NOTE — Transfer of Care (Signed)
Immediate Anesthesia Transfer of Care Note  Patient: Jeremy [name]  Procedure(s) Performed: IR WITH ANESTHESIA  Patient Location: 2H10  Anesthesia Type: General Anesthesia  Time of Death called at 07:25. Patient transferred [place].

## 2023-01-23 NOTE — Sedation Documentation (Signed)
Patient moved from IR table to bed by this RN, Phillip Heal, RN from 2 Heart, CRNA and IR staff.  Patient transported from IR suite to 2H10 via bed for post-care and family care.  Phillip Heal, RN received pt and report by CRNA [place] given with complete understanding voiced at this time.

## 2023-01-23 NOTE — Progress Notes (Signed)
Called to IR due to code blue. Catheter directed TPA infusing. Getting epi & CP per ACLS protocol. Vaso pushes and bicarb + calcium given. Vasopressors still infusing via pump. Briefly achieved ROSC with sinus tachycardia and milrinone added, vasopressin continued, epi & NE increased. Still needed epi pushes, developed recurrent brady PEA arrest and ACLS restarted. TNK boluses given (initally from pump). Family brought to bedside during the code. Ongoing resuscitation per ACLS. Family updated that ongoing efforts to restart his heart would likely be futile and resuscitation was stopped. He developed PEA again. Family updated and condolences offered to his parents and step-father. He will be brought back to the ICU for family visitation. The chaplain has been called. All lines & tubes left in place.  Additional cc time: 50 min.  Julian Hy, DO 2023-01-20 7:38 PM [facility]  For contact information, see Amion. If no response to pager, please call PCCM consult pager. After hours, 7PM- 7AM, please call Elink.

## 2023-01-23 NOTE — Death Summary Note (Signed)
DEATH SUMMARY   Patient Details  Name: Jeremy [name] MRN: [id number] DOB: [birth year]  Admission/Discharge Information   Admit Date:  01/14/2023  Date of Death: Date of Death: 01-17-2023  Time of Death: Time of Death: 1825  Length of Stay: 3  Referring Physician: [facility]   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 year old male  with PMH significant for ADHD, OSA and Down's Syndrome who presented to the ED with one day of  L leg and foot pain, family noted the leg looking pale and blue so brought him to the ED.  No recent travel, surgeries or trauma.   His work-up in the ED was significant for acute  and extensive DVT of the L leg.  His oxygen saturations were in the low 90%'s and he required 2-3L [place] O2 but was not tachycardic or hypotensive.  CTA chest revealed acute PE with RV/LV ratio of 1.42. labs showed creatinine of 1.46, BNP 137,  troponin 40, WBC 12, lactic acid pending.  He was started on heparin and PCCM consulted. Elizah denies any chest pain or shortness of breath.   He was managed with IV heparin with ongoing leg pain and evidence of threatened perfusion of his LLE.  He was managed in the intensive care unit but remained  hemodynamically stable while in the ICU.  After evaluation with vascular surgery, interventional radiology and critical care he was deemed to be a good candidate for mechanical thrombectomy of his pulmonary embolism and left lower extremity thrombus.  His thrombus was high risk for embolization, threatening future embolic events that could become life-threatening.  Follow-up CT scan the day of his procedure demonstrated ongoing large clot burden in his PA.  He underwent mechanical thrombectomy of his pulmonary artery and his left lower extremity.  Subsequently he decompensated in the postprocedural area and required intubation.  Catheters for catheter directed thrombolytics were placed.  Despite tPA infusing and hemodynamic support provided by anesthesia in addition to respiratory support he decompensated from acute right heart failure and had cardiac arrest.  Despite prolonged resuscitation attempts he was unable to be resuscitated.  Resuscitation efforts were stopped and he expired.  Family was brought to the procedure area during resuscitation.  He was brought back to the intensive care unit for family visitation.   Pertinent Labs and Studies  Significant Diagnostic Studies IR US Guide Vasc Access Right  Result Date: 12/25/2022 INDICATION: 32 year old male with history of down syndrome and acute, intermediate high risk pulmonary embolism in addition to extensive iliofemoral left lower extremity deep vein thrombosis with phlegmasia. EXAM: 1. Ultrasound-guided vascular access of the right greater saphenous vein. 2. Selective catheterization and angiography of the pulmonary arteries. 3. Pulmonary manometry. 4. Aspiration thrombectomy of the left pulmonary artery. 5. Selective catheterization and venography of the left iliofemoral veins. 6. Aspiration thrombectomy of  the left external iliac, common femoral, and central femoral veins. 7. Pharmacomechanical thrombolysis of the left external iliac and common  femoral veins. 8. Balloon angioplasty of the left external iliac, common femoral, and central femoral veins. 9. Ultrasound-guided vascular access of the left internal jugular vein. 10. Central venous catheter placement. 11. Ultrasound-guided vascular access of the right internal jugular vein x2. 12. Placement of bilateral pulmonary artery thrombolysis catheters with initiation of catheter directed thrombolysis. COMPARISON:  12/21/2022, January 19, 2023 MEDICATIONS: 8 mg tPA administered into the left common femoral vein ANESTHESIA/SEDATION: The patient was under the care of the department of anesthesia throughout the procedure. Please refer to the anesthesia record for further details. FLUOROSCOPY TIME:  One thousand two hundred seventy-five mGy COMPLICATIONS: SIR LEVEL F - Death. TECHNIQUE: This procedure was performed with assistance by my partner, Dr. Mauri Reading Mir. Informed written consent was obtained from the patient and patient's family after a thorough discussion of the procedural risks, benefits and alternatives. All questions were addressed. Maximal Sterile Barrier Technique was utilized including caps, mask, sterile gowns, sterile gloves, sterile drape, hand hygiene and skin antiseptic. A timeout was performed prior to the initiation of the procedure. The patient was placed supine on the IR table in the right groin was prepped and draped in standard fashion. Preprocedure ultrasound evaluation demonstrated patency of the right common femoral and rate greater saphenous veins. The procedure was planned. Subdermal Local anesthesia was administered at the planned needle entry site. A small skin nick was made. Under direct ultrasound visualization, the right greater saphenous vein was punctured with a 21 gauge micropuncture needle. A micropuncture sheath was inserted and exchanged over a Rosen wire for an 8 [place] vascular sheath. Next, 2 Perclose devices were deployed at the 10 o'clock and 2 o'clock positions in pre  close fashion. Serial dilation was performed over a Wholey wire followed by placement of a 16 French, 33 cm dry seal sheath. In a coaxial fashion, a 7 [place], 70 cm Ansel sheath was then placed with its tip positioned at the level of the right atrium. A 6 French angled pigtail catheter was then directed from the right atrium through the right ventricle and into the main pulmonary artery with ease. Main pulmonary artery pressures were then measured and recorded as 77/40 mmHg, mean 55 mmHg. Pulmonary angiogram was performed which demonstrated adequate perfusion to the right lung with perfusional defect in the left mid to lower lobes compatible with known large distal main left pulmonary artery thrombus. The pigtail catheter and 7 French sheath were then removed over the [facility] wire and the 16 French Penumbra aspiration catheter and 6 [place] select catheter were introduced in coaxial fashion to the level of the main pulmonary artery. Over the [facility] wire, the select catheter followed by the aspiration catheter were then directed into the left inferior pulmonary artery. Aspiration thrombectomy was then performed with yield of large clot burden in the aspiration canister with estimated blood loss of approximately 20 mL. The aspiration catheter was retracted to the level of the main pulmonary artery and pulmonary manometry was repeated and recorded as 68/20 mmHg, mean 35 mmHg. Pulmonary angiogram was repeated which demonstrated significantly improved perfusion of the left lung. The has pre shin catheter was removed. Attention was then turned toward the left lower extremity deep vein thrombosis. The 16 French sheath was retracted to the level of the iliac vein confluence. Using an Omni Flush catheter and Glidewire, left common, external, and femoral arteries were selected with moderate difficulty.  The catheter was exchanged for a 6 [place], angled catheter and left lower extremity venogram was performed. Venogram was  significant for patent left common iliac vein with extensive thrombus burden throughout the left external iliac and common femoral veins extending to the central aspect of the superficial femoral vein. The mid and peripheral superficial femoral veins were widely patent. Aspiration thrombectomy was again performed with the 16 French Penumbra aspiration catheter from the level of the external iliac vein through the central superficial femoral vein. This required multiple catheter exchanges with thick, subacute appearing thrombus that repeatedly clogged the aspiration catheter. After multiple passes, there was large volume of acute to subacute appearing thrombus within the aspiration canister. Repeat left lower extremity venogram demonstrated persistent thrombus in the region of the left common femoral vein. Therefore, balloon angioplasty was performed with a 10 mm by 8 cm Athletis balloon. Repeat venogram demonstrated mild improvement of the external iliac vein with persistent thrombus in the common femoral vein. Given resistance to aspiration thrombectomy and balloon maceration, a total of 8 mg tPA was administered into the thrombus via 6 French angled tip catheter. Next, mechanical thrombectomy was performed with the cleaner device with multiple passes. Repeat left lower extremity venogram demonstrated persistent irregular stenosis in the common femoral vein. Therefore, balloon angioplasty was performed in multiple stations with a 14 mm x 4 cm atlas balloon from the central superficial femoral vein through the external iliac vein. Completion venogram demonstrated significantly improved patency and brisk antegrade flow with persistent wall adherent filling defects throughout the common femoral vein, likely chronic thrombus. The decision was made to stop at this point. The catheters and sheath were removed over a wire and the Perclose devices were tied without complication. Hemostasis was achieved immediately. Sterile  bandage was applied. A Ted hose was placed in the left lower extremity and a therapeutic single dose of Lovenox was administered in the IR suite. After updating the family that the procedure well, was notified by the IR staff that the patient decompensated prior to being transferred to the recovery room with Anesthesia. The patient experienced an episode of bradycardia and agonal breathing therefore the decision was made by the anesthesia team to intubate the patient and began resuscitating. A notified the family this change and discuss the option of placing additional thrombolysis catheters as we suspected a possible etiology of hemodynamic decompensation being related to further development of pulmonary embolism after lower extremity intervention. The family was amenable. The bilateral necks were prepped and draped in standard fashion. As per request of the anesthesia team, a central line was necessary for continued resuscitation. Therefore, preprocedure ultrasound evaluation of the left neck demonstrated a patent and compressible left internal jugular vein. The procedure was planned. Subdermal Local anesthesia was provided with 1% lidocaine. A small skin nick was made. Under direct ultrasound visualization, the left internal jugular chain was punctured with a 21 gauge micropuncture needle. A permanent image was captured and stored in the record. a micropuncture sheath was introduced and an 035 J wire was directed to the level of the right atrium. After serial dilation, a 9 French triple-lumen central venous catheter was placed with the catheter tip near the cavoatrial junction. The lumens flushed and aspirated without difficulty. The catheter was secured to the neck with an interrupted 0 silk suture and a sterile bandage was applied. The anesthesia team began to use the catheter immediately. Preprocedure evaluation of the right internal jugular vein demonstrated patent see of the right internal jugular  vein  without evidence of thrombus. The procedure was planned. Subdermal Local anesthesia was provided with 1% lidocaine at the planned needle entry sites. Two small skin nicks were made. Under direct ultrasound visualization, a 21 gauge micropuncture needle was used to puncture the right internal jugular vein in 2 separate locations. Permanent images were captured and stored in the record. Micropuncture sheath was introduced followed by placement of a Rosen wire to the level of the inferior vena cava at each access site. Through 1 of the access sites, the wire was retracted after placement of an angled pigtail catheter and the pigtail catheter was directed through the right atrium, right ventricle, and into the right pulmonary artery. Limited pulmonary angiogram was performed to confirm location within a segmental pulmonary artery branch in the right inferior lobe. Over a Rosen wire, the catheter was removed and exchanged for a 90 cm, 10 cm infusion length UniFuse catheter. The identical procedure was performed with through the other 6 French jugular sheath to catheterize the left pulmonary artery in place a similar catheter. The catheters were affixed at the skin entry sites and marked with laterality. TPA infusions were connected and initiated. Sterile bandages were applied to the catheters and sheaths. Unfortunately, prior to transfer to the recovery room, the patient experienced cardiac arrest. Anesthesia colleagues, pulmonary critical care, and pharmacy worked to resuscitate the patient achieving ROSC multiple times. The family was updated about the sternal events and were invited to the interventional radiology suite. After over an hour of resuscitation, the patient continued to decompensate and could not maintain pulse respiration independently. The decision was made interdisciplinary and with the family to discontinue efforts. The patient expired in the interventional radiology suite. IMPRESSION: *Technically  successful left pulmonary artery aspiration thrombectomy with decrease of mean pulmonary artery pressure from 55 to 35. 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 *Due to patient decompensation in the Interventional Radiology suite, a left internal jugular central venous catheter was placed emergently. *Additionally, emergent catheterization and placement of bilateral pulmonary artery thrombolysis infusion catheters was performed and thrombolysis was initiated. Marliss Coots, MD [facility] Radiology Electronically Signed   By: Marliss Coots M.D.   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 EXAM: 1. Ultrasound-guided vascular access of the right greater saphenous vein. 2. Selective catheterization and angiography of the pulmonary arteries. 3. Pulmonary manometry. 4. Aspiration thrombectomy of the left pulmonary artery. 5. Selective catheterization and venography of the left iliofemoral veins. 6. Aspiration thrombectomy of the left external iliac, common femoral, and central femoral veins. 7. Pharmacomechanical thrombolysis of the left external iliac and common femoral veins. 8. Balloon angioplasty of the left external iliac, common femoral, and central femoral veins. 9. Ultrasound-guided vascular access of the left internal jugular vein. 10. Central venous catheter placement. 11. Ultrasound-guided vascular access of the right internal jugular vein x2. 12. Placement of bilateral pulmonary artery thrombolysis catheters with initiation of catheter  directed thrombolysis. COMPARISON:  12/21/2022, 2023-01-12 MEDICATIONS: 8 mg tPA administered into the left common femoral vein ANESTHESIA/SEDATION: The patient was under the care of the department of anesthesia throughout the procedure. Please refer to the anesthesia record for  further details. FLUOROSCOPY TIME:  One thousand two hundred seventy-five mGy COMPLICATIONS: SIR LEVEL F - Death. TECHNIQUE: This procedure was performed with assistance by my partner, Dr. Mauri Reading Mir. Informed written consent was obtained from the patient and patient's family after a thorough discussion of the procedural risks, benefits and alternatives. All questions were addressed. Maximal Sterile Barrier Technique was utilized including caps, mask, sterile gowns, sterile gloves, sterile drape, hand hygiene and skin antiseptic. A timeout was performed prior to the initiation of the procedure. The patient was placed supine on the IR table in the right groin was prepped and draped in standard fashion. Preprocedure ultrasound evaluation demonstrated patency of the right common femoral and rate greater saphenous veins. The procedure was planned. Subdermal Local anesthesia was administered at the planned needle entry site. A small skin nick was made. Under direct ultrasound visualization, the right greater saphenous vein was punctured with a 21 gauge micropuncture needle. A micropuncture sheath was inserted and exchanged over a Rosen wire for an 8 [place] vascular sheath. Next, 2 Perclose devices were deployed at the 10 o'clock and 2 o'clock positions in pre close fashion. Serial dilation was performed over a Wholey wire followed by placement of a 16 French, 33 cm dry seal sheath. In a coaxial fashion, a 7 [place], 70 cm Ansel sheath was then placed with its tip positioned at the level of the right atrium. A 6 French angled pigtail catheter was then directed from the right atrium through the right ventricle and into the main pulmonary artery  with ease. Main pulmonary artery pressures were then measured and recorded as 77/40 mmHg, mean 55 mmHg. Pulmonary angiogram was performed which demonstrated adequate perfusion to the right lung with perfusional defect in the left mid to lower lobes compatible with known large distal main left pulmonary artery thrombus. The pigtail catheter and 7 French sheath were then removed over the [facility] wire and the 16 French Penumbra aspiration catheter and 6 [place] select catheter were introduced in coaxial fashion to the level of the main pulmonary artery. Over the [facility] wire, the select catheter followed by the aspiration catheter were then directed into the left inferior pulmonary artery. Aspiration thrombectomy was then performed with yield of large clot burden in the aspiration canister with estimated blood loss of approximately 20 mL. The aspiration catheter was retracted to the level of the main pulmonary artery and pulmonary manometry was repeated and recorded as 68/20 mmHg, mean 35 mmHg. Pulmonary angiogram was repeated which demonstrated significantly improved perfusion of the left lung. The has pre shin catheter was removed. Attention was then turned toward the left lower extremity deep vein thrombosis. The 16 French sheath was retracted to the level of the iliac vein confluence. Using an Omni Flush catheter and Glidewire, left common, external, and femoral arteries were selected with moderate difficulty. The catheter was exchanged for a 6 [place], angled catheter and left lower extremity venogram was performed. Venogram was significant for patent left common iliac vein with extensive thrombus burden throughout the left external iliac and common femoral veins extending to the central aspect of the superficial femoral vein. The mid and peripheral superficial femoral veins were widely patent. Aspiration thrombectomy was again performed with the 16 French Penumbra aspiration catheter from the level of the external  iliac vein through the central superficial femoral vein. This required multiple catheter exchanges with thick, subacute appearing thrombus that repeatedly clogged the aspiration catheter. After multiple passes, there was large volume of acute to subacute appearing  thrombus within the aspiration canister. Repeat left lower extremity venogram demonstrated persistent thrombus in the region of the left common femoral vein. Therefore, balloon angioplasty was performed with a 10 mm by 8 cm Athletis balloon. Repeat venogram demonstrated mild improvement of the external iliac vein with persistent thrombus in the common femoral vein. Given resistance to aspiration thrombectomy and balloon maceration, a total of 8 mg tPA was administered into the thrombus via 6 French angled tip catheter. Next, mechanical thrombectomy was performed with the cleaner device with multiple passes. Repeat left lower extremity venogram demonstrated persistent irregular stenosis in the common femoral vein. Therefore, balloon angioplasty was performed in multiple stations with a 14 mm x 4 cm atlas balloon from the central superficial femoral vein through the external iliac vein. Completion venogram demonstrated significantly improved patency and brisk antegrade flow with persistent wall adherent filling defects throughout the common femoral vein, likely chronic thrombus. The decision was made to stop at this point. The catheters and sheath were removed over a wire and the Perclose devices were tied without complication. Hemostasis was achieved immediately. Sterile bandage was applied. A Ted hose was placed in the left lower extremity and a therapeutic single dose of Lovenox was administered in the IR suite. After updating the family that the procedure well, was notified by the IR staff that the patient decompensated prior to being transferred to the recovery room with Anesthesia. The patient experienced an episode of bradycardia and agonal breathing  therefore the decision was made by the anesthesia team to intubate the patient and began resuscitating. A notified the family this change and discuss the option of placing additional thrombolysis catheters as we suspected a possible etiology of hemodynamic decompensation being related to further development of pulmonary embolism after lower extremity intervention. The family was amenable. The bilateral necks were prepped and draped in standard fashion. As per request of the anesthesia team, a central line was necessary for continued resuscitation. Therefore, preprocedure ultrasound evaluation of the left neck demonstrated a patent and compressible left internal jugular vein. The procedure was planned. Subdermal Local anesthesia was provided with 1% lidocaine. A small skin nick was made. Under direct ultrasound visualization, the left internal jugular chain was punctured with a 21 gauge micropuncture needle. A permanent image was captured and stored in the record. a micropuncture sheath was introduced and an 035 J wire was directed to the level of the right atrium. After serial dilation, a 9 French triple-lumen central venous catheter was placed with the catheter tip near the cavoatrial junction. The lumens flushed and aspirated without difficulty. The catheter was secured to the neck with an interrupted 0 silk suture and a sterile bandage was applied. The anesthesia team began to use the catheter immediately. Preprocedure evaluation of the right internal jugular vein demonstrated patent see of the right internal jugular vein without evidence of thrombus. The procedure was planned. Subdermal Local anesthesia was provided with 1% lidocaine at the planned needle entry sites. Two small skin nicks were made. Under direct ultrasound visualization, a 21 gauge micropuncture needle was used to puncture the right internal jugular vein in 2 separate locations. Permanent images were captured and stored in the record.  Micropuncture sheath was introduced followed by placement of a Rosen wire to the level of the inferior vena cava at each access site. Through 1 of the access sites, the wire was retracted after placement of an angled pigtail catheter and the pigtail catheter was directed through the right atrium, right ventricle, and  into the right pulmonary artery. Limited pulmonary angiogram was performed to confirm location within a segmental pulmonary artery branch in the right inferior lobe. Over a Rosen wire, the catheter was removed and exchanged for a 90 cm, 10 cm infusion length UniFuse catheter. The identical procedure was performed with through the other 6 French jugular sheath to catheterize the left pulmonary artery in place a similar catheter. The catheters were affixed at the skin entry sites and marked with laterality. TPA infusions were connected and initiated. Sterile bandages were applied to the catheters and sheaths. Unfortunately, prior to transfer to the recovery room, the patient experienced cardiac arrest. Anesthesia colleagues, pulmonary critical care, and pharmacy worked to resuscitate the patient achieving ROSC multiple times. The family was updated about the sternal events and were invited to the interventional radiology suite. After over an hour of resuscitation, the patient continued to decompensate and could not maintain pulse respiration independently. The decision was made interdisciplinary and with the family to discontinue efforts. The patient expired in the interventional radiology suite. IMPRESSION: *Technically successful left pulmonary artery aspiration thrombectomy with decrease of mean pulmonary artery pressure from 55 to 35. 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 *Due to patient decompensation in the Interventional  Radiology suite, a left internal jugular central venous catheter was placed emergently. *Additionally, emergent catheterization and placement of bilateral pulmonary artery thrombolysis infusion catheters was performed and thrombolysis was initiated. Marliss Coots, MD [facility] Radiology Electronically Signed   By: Marliss Coots M.D.   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 EXAM: 1. Ultrasound-guided vascular access of the right greater saphenous vein. 2. Selective catheterization and angiography of the pulmonary arteries. 3. Pulmonary manometry. 4. Aspiration thrombectomy of the left pulmonary artery. 5. Selective catheterization and venography of the left iliofemoral veins. 6. Aspiration thrombectomy of the left external iliac, common femoral, and central femoral veins. 7. Pharmacomechanical thrombolysis of the left external iliac and common femoral veins. 8. Balloon angioplasty of the left external iliac, common femoral, and central femoral veins. 9. Ultrasound-guided vascular access of the left internal jugular vein. 10. Central venous catheter placement. 11. Ultrasound-guided vascular access of the right internal jugular vein x2. 12. Placement of bilateral pulmonary artery thrombolysis catheters with initiation of catheter directed thrombolysis. COMPARISON:  January 01, 2023, 01/01/2023 MEDICATIONS: 8 mg tPA administered into the left common femoral vein ANESTHESIA/SEDATION: The patient was under the care of the department of anesthesia throughout the procedure. Please refer to the anesthesia record for further details. FLUOROSCOPY TIME:  One thousand two hundred seventy-five mGy COMPLICATIONS: SIR LEVEL F -  Death. TECHNIQUE: This procedure was performed with assistance by my partner, Dr. Mauri Reading Mir. Informed written consent was obtained from the patient and patient's family after a thorough discussion of the procedural risks, benefits and alternatives. All questions were addressed. Maximal Sterile Barrier Technique was utilized including caps, mask, sterile gowns, sterile gloves, sterile drape, hand hygiene and skin antiseptic. A timeout was performed prior to the initiation of the procedure. The patient was placed supine on the IR table in the right groin was prepped and draped in standard fashion. Preprocedure ultrasound evaluation demonstrated patency of the right common  femoral and rate greater saphenous veins. The procedure was planned. Subdermal Local anesthesia was administered at the planned needle entry site. A small skin nick was made. Under direct ultrasound visualization, the right greater saphenous vein was punctured with a 21 gauge micropuncture needle. A micropuncture sheath was inserted and exchanged over a Rosen wire for an 8 [place] vascular sheath. Next, 2 Perclose devices were deployed at the 10 o'clock and 2 o'clock positions in pre close fashion. Serial dilation was performed over a Wholey wire followed by placement of a 16 French, 33 cm dry seal sheath. In a coaxial fashion, a 7 [place], 70 cm Ansel sheath was then placed with its tip positioned at the level of the right atrium. A 6 French angled pigtail catheter was then directed from the right atrium through the right ventricle and into the main pulmonary artery with ease. Main pulmonary artery pressures were then measured and recorded as 77/40 mmHg, mean 55 mmHg. Pulmonary angiogram was performed which demonstrated adequate perfusion to the right lung with perfusional defect in the left mid to lower lobes compatible with known large distal main left pulmonary artery thrombus. The pigtail catheter and 7 French sheath were then removed over the  [facility] wire and the 16 French Penumbra aspiration catheter and 6 [place] select catheter were introduced in coaxial fashion to the level of the main pulmonary artery. Over the [facility] wire, the select catheter followed by the aspiration catheter were then directed into the left inferior pulmonary artery. Aspiration thrombectomy was then performed with yield of large clot burden in the aspiration canister with estimated blood loss of approximately 20 mL. The aspiration catheter was retracted to the level of the main pulmonary artery and pulmonary manometry was repeated and recorded as 68/20 mmHg, mean 35 mmHg. Pulmonary angiogram was repeated which demonstrated significantly improved perfusion of the left lung. The has pre shin catheter was removed. Attention was then turned toward the left lower extremity deep vein thrombosis. The 16 French sheath was retracted to the level of the iliac vein confluence. Using an Omni Flush catheter and Glidewire, left common, external, and femoral arteries were selected with moderate difficulty. The catheter was exchanged for a 6 [place], angled catheter and left lower extremity venogram was performed. Venogram was significant for patent left common iliac vein with extensive thrombus burden throughout the left external iliac and common femoral veins extending to the central aspect of the superficial femoral vein. The mid and peripheral superficial femoral veins were widely patent. Aspiration thrombectomy was again performed with the 16 French Penumbra aspiration catheter from the level of the external iliac vein through the central superficial femoral vein. This required multiple catheter exchanges with thick, subacute appearing thrombus that repeatedly clogged the aspiration catheter. After multiple passes, there was large volume of acute to subacute appearing thrombus within the aspiration canister. Repeat left lower extremity venogram demonstrated persistent thrombus in the region  of the left common femoral vein. Therefore, balloon angioplasty was performed with a 10 mm by 8 cm Athletis balloon. Repeat venogram demonstrated mild improvement of the external iliac vein with persistent thrombus in the common femoral vein. Given resistance to aspiration thrombectomy and balloon maceration, a total of 8 mg tPA was administered into the thrombus via 6 French angled tip catheter. Next, mechanical thrombectomy was performed with the cleaner device with multiple passes. Repeat left lower extremity venogram demonstrated persistent irregular stenosis in the common femoral vein. Therefore, balloon angioplasty was performed in multiple stations with a 14  mm x 4 cm atlas balloon from the central superficial femoral vein through the external iliac vein. Completion venogram demonstrated significantly improved patency and brisk antegrade flow with persistent wall adherent filling defects throughout the common femoral vein, likely chronic thrombus. The decision was made to stop at this point. The catheters and sheath were removed over a wire and the Perclose devices were tied without complication. Hemostasis was achieved immediately. Sterile bandage was applied. A Ted hose was placed in the left lower extremity and a therapeutic single dose of Lovenox was administered in the IR suite. After updating the family that the procedure well, was notified by the IR staff that the patient decompensated prior to being transferred to the recovery room with Anesthesia. The patient experienced an episode of bradycardia and agonal breathing therefore the decision was made by the anesthesia team to intubate the patient and began resuscitating. A notified the family this change and discuss the option of placing additional thrombolysis catheters as we suspected a possible etiology of hemodynamic decompensation being related to further development of pulmonary embolism after lower extremity intervention. The family was  amenable. The bilateral necks were prepped and draped in standard fashion. As per request of the anesthesia team, a central line was necessary for continued resuscitation. Therefore, preprocedure ultrasound evaluation of the left neck demonstrated a patent and compressible left internal jugular vein. The procedure was planned. Subdermal Local anesthesia was provided with 1% lidocaine. A small skin nick was made. Under direct ultrasound visualization, the left internal jugular chain was punctured with a 21 gauge micropuncture needle. A permanent image was captured and stored in the record. a micropuncture sheath was introduced and an 035 J wire was directed to the level of the right atrium. After serial dilation, a 9 French triple-lumen central venous catheter was placed with the catheter tip near the cavoatrial junction. The lumens flushed and aspirated without difficulty. The catheter was secured to the neck with an interrupted 0 silk suture and a sterile bandage was applied. The anesthesia team began to use the catheter immediately. Preprocedure evaluation of the right internal jugular vein demonstrated patent see of the right internal jugular vein without evidence of thrombus. The procedure was planned. Subdermal Local anesthesia was provided with 1% lidocaine at the planned needle entry sites. Two small skin nicks were made. Under direct ultrasound visualization, a 21 gauge micropuncture needle was used to puncture the right internal jugular vein in 2 separate locations. Permanent images were captured and stored in the record. Micropuncture sheath was introduced followed by placement of a Rosen wire to the level of the inferior vena cava at each access site. Through 1 of the access sites, the wire was retracted after placement of an angled pigtail catheter and the pigtail catheter was directed through the right atrium, right ventricle, and into the right pulmonary artery. Limited pulmonary angiogram was  performed to confirm location within a segmental pulmonary artery branch in the right inferior lobe. Over a Rosen wire, the catheter was removed and exchanged for a 90 cm, 10 cm infusion length UniFuse catheter. The identical procedure was performed with through the other 6 French jugular sheath to catheterize the left pulmonary artery in place a similar catheter. The catheters were affixed at the skin entry sites and marked with laterality. TPA infusions were connected and initiated. Sterile bandages were applied to the catheters and sheaths. Unfortunately, prior to transfer to the recovery room, the patient experienced cardiac arrest. Anesthesia colleagues, pulmonary critical care, and pharmacy  worked to resuscitate the patient achieving ROSC multiple times. The family was updated about the sternal events and were invited to the interventional radiology suite. After over an hour of resuscitation, the patient continued to decompensate and could not maintain pulse respiration independently. The decision was made interdisciplinary and with the family to discontinue efforts. The patient expired in the interventional radiology suite. IMPRESSION: *Technically successful left pulmonary artery aspiration thrombectomy with decrease of mean pulmonary artery pressure from 55 to 35. 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 *Due to patient decompensation in the Interventional Radiology suite, a left internal jugular central venous catheter was placed emergently. *Additionally, emergent catheterization and placement of bilateral pulmonary artery thrombolysis infusion catheters was performed and thrombolysis was initiated. Marliss Coots, MD [facility] Radiology Electronically Signed   By: Marliss Coots M.D.    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 EXAM: 1. Ultrasound-guided vascular access of the right greater saphenous vein. 2. Selective catheterization and angiography of the pulmonary arteries. 3. Pulmonary manometry. 4. Aspiration thrombectomy of the left pulmonary artery. 5. Selective catheterization and venography of the left iliofemoral veins. 6. Aspiration thrombectomy of the left external iliac, common femoral, and central femoral veins. 7. Pharmacomechanical thrombolysis of the left external iliac and common femoral veins. 8. Balloon angioplasty of the left external iliac, common femoral, and central femoral veins. 9. Ultrasound-guided vascular access of the left internal jugular vein. 10. Central venous catheter placement. 11. Ultrasound-guided vascular access of the right internal jugular vein x2. 12. Placement of bilateral pulmonary artery thrombolysis catheters with initiation of catheter directed thrombolysis. COMPARISON:  01/03/2023, 01/14/2023 MEDICATIONS: 8 mg tPA administered into the left common femoral vein ANESTHESIA/SEDATION: The patient was under the care of the department of anesthesia throughout the procedure. Please refer to the anesthesia record for further details. FLUOROSCOPY TIME:  One thousand two hundred seventy-five mGy COMPLICATIONS: SIR LEVEL F - Death. TECHNIQUE: This procedure was performed with assistance by my partner, Dr. Mauri Reading Mir. Informed written consent was obtained from the patient and patient's family after a thorough discussion of the procedural risks, benefits and alternatives. All questions were addressed. Maximal Sterile Barrier Technique was utilized including caps, mask, sterile gowns, sterile gloves, sterile  drape, hand hygiene and skin antiseptic. A timeout was performed prior to the initiation of the procedure. The patient was placed supine on the IR table in the right groin was prepped and draped in standard fashion. Preprocedure ultrasound evaluation demonstrated patency of the right common femoral and rate greater saphenous veins. The procedure was planned. Subdermal Local anesthesia was administered at the planned needle entry site. A small skin nick was made. Under direct ultrasound visualization, the right greater saphenous vein was punctured with a 21 gauge micropuncture needle. A micropuncture sheath was inserted and exchanged over a Rosen wire for an 8 [place] vascular sheath. Next, 2 Perclose devices were deployed at the 10 o'clock and 2 o'clock positions in pre close fashion. Serial dilation was performed over a Wholey wire followed by placement of a 16 French, 33 cm dry seal sheath. In a coaxial fashion, a 7 [place], 70 cm Ansel sheath was then placed with  its tip positioned at the level of the right atrium. A 6 French angled pigtail catheter was then directed from the right atrium through the right ventricle and into the main pulmonary artery with ease. Main pulmonary artery pressures were then measured and recorded as 77/40 mmHg, mean 55 mmHg. Pulmonary angiogram was performed which demonstrated adequate perfusion to the right lung with perfusional defect in the left mid to lower lobes compatible with known large distal main left pulmonary artery thrombus. The pigtail catheter and 7 French sheath were then removed over the [facility] wire and the 16 French Penumbra aspiration catheter and 6 [place] select catheter were introduced in coaxial fashion to the level of the main pulmonary artery. Over the [facility] wire, the select catheter followed by the aspiration catheter were then directed into the left inferior pulmonary artery. Aspiration thrombectomy was then performed with yield of large clot burden in the  aspiration canister with estimated blood loss of approximately 20 mL. The aspiration catheter was retracted to the level of the main pulmonary artery and pulmonary manometry was repeated and recorded as 68/20 mmHg, mean 35 mmHg. Pulmonary angiogram was repeated which demonstrated significantly improved perfusion of the left lung. The has pre shin catheter was removed. Attention was then turned toward the left lower extremity deep vein thrombosis. The 16 French sheath was retracted to the level of the iliac vein confluence. Using an Omni Flush catheter and Glidewire, left common, external, and femoral arteries were selected with moderate difficulty. The catheter was exchanged for a 6 [place], angled catheter and left lower extremity venogram was performed. Venogram was significant for patent left common iliac vein with extensive thrombus burden throughout the left external iliac and common femoral veins extending to the central aspect of the superficial femoral vein. The mid and peripheral superficial femoral veins were widely patent. Aspiration thrombectomy was again performed with the 16 French Penumbra aspiration catheter from the level of the external iliac vein through the central superficial femoral vein. This required multiple catheter exchanges with thick, subacute appearing thrombus that repeatedly clogged the aspiration catheter. After multiple passes, there was large volume of acute to subacute appearing thrombus within the aspiration canister. Repeat left lower extremity venogram demonstrated persistent thrombus in the region of the left common femoral vein. Therefore, balloon angioplasty was performed with a 10 mm by 8 cm Athletis balloon. Repeat venogram demonstrated mild improvement of the external iliac vein with persistent thrombus in the common femoral vein. Given resistance to aspiration thrombectomy and balloon maceration, a total of 8 mg tPA was administered into the thrombus via 6 French angled  tip catheter. Next, mechanical thrombectomy was performed with the cleaner device with multiple passes. Repeat left lower extremity venogram demonstrated persistent irregular stenosis in the common femoral vein. Therefore, balloon angioplasty was performed in multiple stations with a 14 mm x 4 cm atlas balloon from the central superficial femoral vein through the external iliac vein. Completion venogram demonstrated significantly improved patency and brisk antegrade flow with persistent wall adherent filling defects throughout the common femoral vein, likely chronic thrombus. The decision was made to stop at this point. The catheters and sheath were removed over a wire and the Perclose devices were tied without complication. Hemostasis was achieved immediately. Sterile bandage was applied. A Ted hose was placed in the left lower extremity and a therapeutic single dose of Lovenox was administered in the IR suite. After updating the family that the procedure well, was notified by the IR staff that the  patient decompensated prior to being transferred to the recovery room with Anesthesia. The patient experienced an episode of bradycardia and agonal breathing therefore the decision was made by the anesthesia team to intubate the patient and began resuscitating. A notified the family this change and discuss the option of placing additional thrombolysis catheters as we suspected a possible etiology of hemodynamic decompensation being related to further development of pulmonary embolism after lower extremity intervention. The family was amenable. The bilateral necks were prepped and draped in standard fashion. As per request of the anesthesia team, a central line was necessary for continued resuscitation. Therefore, preprocedure ultrasound evaluation of the left neck demonstrated a patent and compressible left internal jugular vein. The procedure was planned. Subdermal Local anesthesia was provided with 1% lidocaine. A  small skin nick was made. Under direct ultrasound visualization, the left internal jugular chain was punctured with a 21 gauge micropuncture needle. A permanent image was captured and stored in the record. a micropuncture sheath was introduced and an 035 J wire was directed to the level of the right atrium. After serial dilation, a 9 French triple-lumen central venous catheter was placed with the catheter tip near the cavoatrial junction. The lumens flushed and aspirated without difficulty. The catheter was secured to the neck with an interrupted 0 silk suture and a sterile bandage was applied. The anesthesia team began to use the catheter immediately. Preprocedure evaluation of the right internal jugular vein demonstrated patent see of the right internal jugular vein without evidence of thrombus. The procedure was planned. Subdermal Local anesthesia was provided with 1% lidocaine at the planned needle entry sites. Two small skin nicks were made. Under direct ultrasound visualization, a 21 gauge micropuncture needle was used to puncture the right internal jugular vein in 2 separate locations. Permanent images were captured and stored in the record. Micropuncture sheath was introduced followed by placement of a Rosen wire to the level of the inferior vena cava at each access site. Through 1 of the access sites, the wire was retracted after placement of an angled pigtail catheter and the pigtail catheter was directed through the right atrium, right ventricle, and into the right pulmonary artery. Limited pulmonary angiogram was performed to confirm location within a segmental pulmonary artery branch in the right inferior lobe. Over a Rosen wire, the catheter was removed and exchanged for a 90 cm, 10 cm infusion length UniFuse catheter. The identical procedure was performed with through the other 6 French jugular sheath to catheterize the left pulmonary artery in place a similar catheter. The catheters were affixed at  the skin entry sites and marked with laterality. TPA infusions were connected and initiated. Sterile bandages were applied to the catheters and sheaths. Unfortunately, prior to transfer to the recovery room, the patient experienced cardiac arrest. Anesthesia colleagues, pulmonary critical care, and pharmacy worked to resuscitate the patient achieving ROSC multiple times. The family was updated about the sternal events and were invited to the interventional radiology suite. After over an hour of resuscitation, the patient continued to decompensate and could not maintain pulse respiration independently. The decision was made interdisciplinary and with the family to discontinue efforts. The patient expired in the interventional radiology suite. IMPRESSION: *Technically successful left pulmonary artery aspiration thrombectomy with decrease of mean pulmonary artery pressure from 55 to 35. 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 *Due to patient decompensation in the Interventional  Radiology suite, a left internal jugular central venous catheter was placed emergently. *Additionally, emergent catheterization and placement of bilateral pulmonary artery thrombolysis infusion catheters was performed and thrombolysis was initiated. Marliss Coots, MD [facility] Radiology Electronically Signed   By: Marliss Coots M.D.   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 EXAM: 1. Ultrasound-guided vascular access of the right greater saphenous vein. 2. Selective catheterization  and angiography of the pulmonary arteries. 3. Pulmonary manometry. 4. Aspiration thrombectomy of the left pulmonary artery. 5. Selective catheterization and venography of the left iliofemoral veins. 6. Aspiration thrombectomy of the left external iliac, common femoral, and central femoral veins. 7. Pharmacomechanical thrombolysis of the left external iliac and common femoral veins. 8. Balloon angioplasty of the left external iliac, common femoral, and central femoral veins. 9. Ultrasound-guided vascular access of the left internal jugular vein. 10. Central venous catheter placement. 11. Ultrasound-guided vascular access of the right internal jugular vein x2. 12. Placement of bilateral pulmonary artery thrombolysis catheters with initiation of catheter directed thrombolysis. COMPARISON:  12/27/2022, 01/20/2023 MEDICATIONS: 8 mg tPA administered into the left common femoral vein ANESTHESIA/SEDATION: The patient was under the care of the department of anesthesia throughout the procedure. Please refer to the anesthesia record for further details. FLUOROSCOPY TIME:  One thousand two hundred seventy-five mGy COMPLICATIONS: SIR LEVEL F - Death. TECHNIQUE: This procedure was performed with assistance by my partner, Dr. Mauri Reading Mir. Informed written consent was obtained from the patient and patient's family after a thorough discussion of the procedural risks, benefits and alternatives. All questions were addressed. Maximal Sterile Barrier Technique was utilized including caps, mask, sterile gowns, sterile gloves, sterile drape, hand hygiene and skin antiseptic. A timeout was performed prior to the initiation of the procedure. The patient was placed supine on the IR table in the right groin was prepped and draped in standard fashion. Preprocedure ultrasound evaluation demonstrated patency of the right common femoral and rate greater saphenous veins. The procedure was planned. Subdermal Local anesthesia was administered at the  planned needle entry site. A small skin nick was made. Under direct ultrasound visualization, the right greater saphenous vein was punctured with a 21 gauge micropuncture needle. A micropuncture sheath was inserted and exchanged over a Rosen wire for an 8 [place] vascular sheath. Next, 2 Perclose devices were deployed at the 10 o'clock and 2 o'clock positions in pre close fashion. Serial dilation was performed over a Wholey wire followed by placement of a 16 French, 33 cm dry seal sheath. In a coaxial fashion, a 7 [place], 70 cm Ansel sheath was then placed with its tip positioned at the level of the right atrium. A 6 French angled pigtail catheter was then directed from the right atrium through the right ventricle and into the main pulmonary artery with ease. Main pulmonary artery pressures were then measured and recorded as 77/40 mmHg, mean 55 mmHg. Pulmonary angiogram was performed which demonstrated adequate perfusion to the right lung with perfusional defect in the left mid to lower lobes compatible with known large distal main left pulmonary artery thrombus. The pigtail catheter and 7 French sheath were then removed over the [facility] wire and the 16 French Penumbra aspiration catheter and 6 [place] select catheter were introduced in coaxial fashion to the level  of the main pulmonary artery. Over the [facility] wire, the select catheter followed by the aspiration catheter were then directed into the left inferior pulmonary artery. Aspiration thrombectomy was then performed with yield of large clot burden in the aspiration canister with estimated blood loss of approximately 20 mL. The aspiration catheter was retracted to the level of the main pulmonary artery and pulmonary manometry was repeated and recorded as 68/20 mmHg, mean 35 mmHg. Pulmonary angiogram was repeated which demonstrated significantly improved perfusion of the left lung. The has pre shin catheter was removed. Attention was then turned toward the left  lower extremity deep vein thrombosis. The 16 French sheath was retracted to the level of the iliac vein confluence. Using an Omni Flush catheter and Glidewire, left common, external, and femoral arteries were selected with moderate difficulty. The catheter was exchanged for a 6 [place], angled catheter and left lower extremity venogram was performed. Venogram was significant for patent left common iliac vein with extensive thrombus burden throughout the left external iliac and common femoral veins extending to the central aspect of the superficial femoral vein. The mid and peripheral superficial femoral veins were widely patent. Aspiration thrombectomy was again performed with the 16 French Penumbra aspiration catheter from the level of the external iliac vein through the central superficial femoral vein. This required multiple catheter exchanges with thick, subacute appearing thrombus that repeatedly clogged the aspiration catheter. After multiple passes, there was large volume of acute to subacute appearing thrombus within the aspiration canister. Repeat left lower extremity venogram demonstrated persistent thrombus in the region of the left common femoral vein. Therefore, balloon angioplasty was performed with a 10 mm by 8 cm Athletis balloon. Repeat venogram demonstrated mild improvement of the external iliac vein with persistent thrombus in the common femoral vein. Given resistance to aspiration thrombectomy and balloon maceration, a total of 8 mg tPA was administered into the thrombus via 6 French angled tip catheter. Next, mechanical thrombectomy was performed with the cleaner device with multiple passes. Repeat left lower extremity venogram demonstrated persistent irregular stenosis in the common femoral vein. Therefore, balloon angioplasty was performed in multiple stations with a 14 mm x 4 cm atlas balloon from the central superficial femoral vein through the external iliac vein. Completion venogram  demonstrated significantly improved patency and brisk antegrade flow with persistent wall adherent filling defects throughout the common femoral vein, likely chronic thrombus. The decision was made to stop at this point. The catheters and sheath were removed over a wire and the Perclose devices were tied without complication. Hemostasis was achieved immediately. Sterile bandage was applied. A Ted hose was placed in the left lower extremity and a therapeutic single dose of Lovenox was administered in the IR suite. After updating the family that the procedure well, was notified by the IR staff that the patient decompensated prior to being transferred to the recovery room with Anesthesia. The patient experienced an episode of bradycardia and agonal breathing therefore the decision was made by the anesthesia team to intubate the patient and began resuscitating. A notified the family this change and discuss the option of placing additional thrombolysis catheters as we suspected a possible etiology of hemodynamic decompensation being related to further development of pulmonary embolism after lower extremity intervention. The family was amenable. The bilateral necks were prepped and draped in standard fashion. As per request of the anesthesia team, a central line was necessary for continued resuscitation. Therefore, preprocedure ultrasound evaluation of the left neck demonstrated a patent and compressible  left internal jugular vein. The procedure was planned. Subdermal Local anesthesia was provided with 1% lidocaine. A small skin nick was made. Under direct ultrasound visualization, the left internal jugular chain was punctured with a 21 gauge micropuncture needle. A permanent image was captured and stored in the record. a micropuncture sheath was introduced and an 035 J wire was directed to the level of the right atrium. After serial dilation, a 9 French triple-lumen central venous catheter was placed with the catheter  tip near the cavoatrial junction. The lumens flushed and aspirated without difficulty. The catheter was secured to the neck with an interrupted 0 silk suture and a sterile bandage was applied. The anesthesia team began to use the catheter immediately. Preprocedure evaluation of the right internal jugular vein demonstrated patent see of the right internal jugular vein without evidence of thrombus. The procedure was planned. Subdermal Local anesthesia was provided with 1% lidocaine at the planned needle entry sites. Two small skin nicks were made. Under direct ultrasound visualization, a 21 gauge micropuncture needle was used to puncture the right internal jugular vein in 2 separate locations. Permanent images were captured and stored in the record. Micropuncture sheath was introduced followed by placement of a Rosen wire to the level of the inferior vena cava at each access site. Through 1 of the access sites, the wire was retracted after placement of an angled pigtail catheter and the pigtail catheter was directed through the right atrium, right ventricle, and into the right pulmonary artery. Limited pulmonary angiogram was performed to confirm location within a segmental pulmonary artery branch in the right inferior lobe. Over a Rosen wire, the catheter was removed and exchanged for a 90 cm, 10 cm infusion length UniFuse catheter. The identical procedure was performed with through the other 6 French jugular sheath to catheterize the left pulmonary artery in place a similar catheter. The catheters were affixed at the skin entry sites and marked with laterality. TPA infusions were connected and initiated. Sterile bandages were applied to the catheters and sheaths. Unfortunately, prior to transfer to the recovery room, the patient experienced cardiac arrest. Anesthesia colleagues, pulmonary critical care, and pharmacy worked to resuscitate the patient achieving ROSC multiple times. The family was updated about the  sternal events and were invited to the interventional radiology suite. After over an hour of resuscitation, the patient continued to decompensate and could not maintain pulse respiration independently. The decision was made interdisciplinary and with the family to discontinue efforts. The patient expired in the interventional radiology suite. IMPRESSION: *Technically successful left pulmonary artery aspiration thrombectomy with decrease of mean pulmonary artery pressure from 55 to 35. 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 *Due to patient decompensation in the Interventional Radiology suite, a left internal jugular central venous catheter was placed emergently. *Additionally, emergent catheterization and placement of bilateral pulmonary artery thrombolysis infusion catheters was performed and thrombolysis was initiated. Marliss Coots, MD Vascular and Interventional Radiology Specialists [facility] Radiology Electronically Signed   By: Marliss Coots M.D.   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 EXAM: 1. Ultrasound-guided vascular access of the right greater saphenous vein. 2. Selective catheterization and angiography of the pulmonary arteries. 3. Pulmonary  manometry. 4. Aspiration thrombectomy of the left pulmonary artery. 5. Selective catheterization and venography of the left iliofemoral veins. 6. Aspiration thrombectomy of the left external iliac, common femoral, and central femoral veins. 7. Pharmacomechanical thrombolysis of the left external iliac and common femoral veins. 8. Balloon  angioplasty of the left external iliac, common femoral, and central femoral veins. 9. Ultrasound-guided vascular access of the left internal jugular vein. 10. Central venous catheter placement. 11. Ultrasound-guided vascular access of the right internal jugular vein x2. 12. Placement of bilateral pulmonary artery thrombolysis catheters with initiation of catheter directed thrombolysis. COMPARISON:  01-01-23, 01/09/2023 MEDICATIONS: 8 mg tPA administered into the left common femoral vein ANESTHESIA/SEDATION: The patient was under the care of the department of anesthesia throughout the procedure. Please refer to the anesthesia record for further details. FLUOROSCOPY TIME:  One thousand two hundred seventy-five mGy COMPLICATIONS: SIR LEVEL F - Death. TECHNIQUE: This procedure was performed with assistance by my partner, Dr. Mauri Reading Mir. Informed written consent was obtained from the patient and patient's family after a thorough discussion of the procedural risks, benefits and alternatives. All questions were addressed. Maximal Sterile Barrier Technique was utilized including caps, mask, sterile gowns, sterile gloves, sterile drape, hand hygiene and skin antiseptic. A timeout was performed prior to the initiation of the procedure. The patient was placed supine on the IR table in the right groin was prepped and draped in standard fashion. Preprocedure ultrasound evaluation demonstrated patency of the right common femoral and rate greater saphenous veins. The procedure was planned. Subdermal Local anesthesia was administered at the planned needle entry site. A small skin nick was made. Under direct ultrasound visualization, the right greater saphenous vein was punctured with a 21 gauge micropuncture needle. A micropuncture sheath was inserted and exchanged over a Rosen wire for an 8 [place] vascular sheath. Next, 2 Perclose devices were deployed at the 10 o'clock and 2 o'clock positions in pre close fashion. Serial  dilation was performed over a Wholey wire followed by placement of a 16 French, 33 cm dry seal sheath. In a coaxial fashion, a 7 [place], 70 cm Ansel sheath was then placed with its tip positioned at the level of the right atrium. A 6 French angled pigtail catheter was then directed from the right atrium through the right ventricle and into the main pulmonary artery with ease. Main pulmonary artery pressures were then measured and recorded as 77/40 mmHg, mean 55 mmHg. Pulmonary angiogram was performed which demonstrated adequate perfusion to the right lung with perfusional defect in the left mid to lower lobes compatible with known large distal main left pulmonary artery thrombus. The pigtail catheter and 7 French sheath were then removed over the [facility] wire and the 16 French Penumbra aspiration catheter and 6 [place] select catheter were introduced in coaxial fashion to the level of the main pulmonary artery. Over the [facility] wire, the select catheter followed by the aspiration catheter were then directed into the left inferior pulmonary artery. Aspiration thrombectomy was then performed with yield of large clot burden in the aspiration canister with estimated blood loss of approximately 20 mL. The aspiration catheter was retracted to the level of the main pulmonary artery and pulmonary manometry was repeated and recorded as 68/20 mmHg, mean 35 mmHg. Pulmonary angiogram was repeated which demonstrated significantly improved perfusion of the left lung. The has pre shin catheter was removed. Attention was then turned toward the left lower extremity deep vein thrombosis. The 16 French sheath was retracted to the level  of the iliac vein confluence. Using an Omni Flush catheter and Glidewire, left common, external, and femoral arteries were selected with moderate difficulty. The catheter was exchanged for a 6 [place], angled catheter and left lower extremity venogram was performed. Venogram was significant for patent left  common iliac vein with extensive thrombus burden throughout the left external iliac and common femoral veins extending to the central aspect of the superficial femoral vein. The mid and peripheral superficial femoral veins were widely patent. Aspiration thrombectomy was again performed with the 16 French Penumbra aspiration catheter from the level of the external iliac vein through the central superficial femoral vein. This required multiple catheter exchanges with thick, subacute appearing thrombus that repeatedly clogged the aspiration catheter. After multiple passes, there was large volume of acute to subacute appearing thrombus within the aspiration canister. Repeat left lower extremity venogram demonstrated persistent thrombus in the region of the left common femoral vein. Therefore, balloon angioplasty was performed with a 10 mm by 8 cm Athletis balloon. Repeat venogram demonstrated mild improvement of the external iliac vein with persistent thrombus in the common femoral vein. Given resistance to aspiration thrombectomy and balloon maceration, a total of 8 mg tPA was administered into the thrombus via 6 French angled tip catheter. Next, mechanical thrombectomy was performed with the cleaner device with multiple passes. Repeat left lower extremity venogram demonstrated persistent irregular stenosis in the common femoral vein. Therefore, balloon angioplasty was performed in multiple stations with a 14 mm x 4 cm atlas balloon from the central superficial femoral vein through the external iliac vein. Completion venogram demonstrated significantly improved patency and brisk antegrade flow with persistent wall adherent filling defects throughout the common femoral vein, likely chronic thrombus. The decision was made to stop at this point. The catheters and sheath were removed over a wire and the Perclose devices were tied without complication. Hemostasis was achieved immediately. Sterile bandage was applied. A Ted  hose was placed in the left lower extremity and a therapeutic single dose of Lovenox was administered in the IR suite. After updating the family that the procedure well, was notified by the IR staff that the patient decompensated prior to being transferred to the recovery room with Anesthesia. The patient experienced an episode of bradycardia and agonal breathing therefore the decision was made by the anesthesia team to intubate the patient and began resuscitating. A notified the family this change and discuss the option of placing additional thrombolysis catheters as we suspected a possible etiology of hemodynamic decompensation being related to further development of pulmonary embolism after lower extremity intervention. The family was amenable. The bilateral necks were prepped and draped in standard fashion. As per request of the anesthesia team, a central line was necessary for continued resuscitation. Therefore, preprocedure ultrasound evaluation of the left neck demonstrated a patent and compressible left internal jugular vein. The procedure was planned. Subdermal Local anesthesia was provided with 1% lidocaine. A small skin nick was made. Under direct ultrasound visualization, the left internal jugular chain was punctured with a 21 gauge micropuncture needle. A permanent image was captured and stored in the record. a micropuncture sheath was introduced and an 035 J wire was directed to the level of the right atrium. After serial dilation, a 9 French triple-lumen central venous catheter was placed with the catheter tip near the cavoatrial junction. The lumens flushed and aspirated without difficulty. The catheter was secured to the neck with an interrupted 0 silk suture and a sterile bandage was applied. The anesthesia  team began to use the catheter immediately. Preprocedure evaluation of the right internal jugular vein demonstrated patent see of the right internal jugular vein without evidence of thrombus.  The procedure was planned. Subdermal Local anesthesia was provided with 1% lidocaine at the planned needle entry sites. Two small skin nicks were made. Under direct ultrasound visualization, a 21 gauge micropuncture needle was used to puncture the right internal jugular vein in 2 separate locations. Permanent images were captured and stored in the record. Micropuncture sheath was introduced followed by placement of a Rosen wire to the level of the inferior vena cava at each access site. Through 1 of the access sites, the wire was retracted after placement of an angled pigtail catheter and the pigtail catheter was directed through the right atrium, right ventricle, and into the right pulmonary artery. Limited pulmonary angiogram was performed to confirm location within a segmental pulmonary artery branch in the right inferior lobe. Over a Rosen wire, the catheter was removed and exchanged for a 90 cm, 10 cm infusion length UniFuse catheter. The identical procedure was performed with through the other 6 French jugular sheath to catheterize the left pulmonary artery in place a similar catheter. The catheters were affixed at the skin entry sites and marked with laterality. TPA infusions were connected and initiated. Sterile bandages were applied to the catheters and sheaths. Unfortunately, prior to transfer to the recovery room, the patient experienced cardiac arrest. Anesthesia colleagues, pulmonary critical care, and pharmacy worked to resuscitate the patient achieving ROSC multiple times. The family was updated about the sternal events and were invited to the interventional radiology suite. After over an hour of resuscitation, the patient continued to decompensate and could not maintain pulse respiration independently. The decision was made interdisciplinary and with the family to discontinue efforts. The patient expired in the interventional radiology suite. IMPRESSION: *Technically successful left pulmonary artery  aspiration thrombectomy with decrease of mean pulmonary artery pressure from 55 to 35. 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 *Due to patient decompensation in the Interventional Radiology suite, a left internal jugular central venous catheter was placed emergently. *Additionally, emergent catheterization and placement of bilateral pulmonary artery thrombolysis infusion catheters was performed and thrombolysis was initiated. Marliss Coots, MD Vascular and Interventional Radiology Specialists [facility] Radiology Electronically Signed   By: Marliss Coots M.D.   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 EXAM: 1. Ultrasound-guided vascular access of the right greater saphenous vein. 2. Selective catheterization and angiography of the pulmonary arteries. 3. Pulmonary manometry. 4. Aspiration thrombectomy of the left pulmonary artery. 5. Selective catheterization and venography of the left iliofemoral veins. 6. Aspiration thrombectomy of the left external iliac, common femoral, and central femoral veins. 7. Pharmacomechanical thrombolysis of the left external iliac and common femoral veins. 8. Balloon angioplasty of the left external iliac, common femoral, and central femoral veins. 9. Ultrasound-guided vascular access of the left internal jugular vein. 10. Central venous catheter placement. 11. Ultrasound-guided vascular access of the right internal jugular vein x2. 12. Placement of bilateral pulmonary artery thrombolysis catheters with initiation of catheter directed thrombolysis.  COMPARISON:  12/21/2022, 2023/01/15 MEDICATIONS: 8 mg tPA administered into the left common femoral vein ANESTHESIA/SEDATION:  The patient was under the care of the department of anesthesia throughout the procedure. Please refer to the anesthesia record for further details. FLUOROSCOPY TIME:  One thousand two hundred seventy-five mGy COMPLICATIONS: SIR LEVEL F - Death. TECHNIQUE: This procedure was performed with assistance by my partner, Dr. Mauri Reading Mir. Informed written consent was obtained from the patient and patient's family after a thorough discussion of the procedural risks, benefits and alternatives. All questions were addressed. Maximal Sterile Barrier Technique was utilized including caps, mask, sterile gowns, sterile gloves, sterile drape, hand hygiene and skin antiseptic. A timeout was performed prior to the initiation of the procedure. The patient was placed supine on the IR table in the right groin was prepped and draped in standard fashion. Preprocedure ultrasound evaluation demonstrated patency of the right common femoral and rate greater saphenous veins. The procedure was planned. Subdermal Local anesthesia was administered at the planned needle entry site. A small skin nick was made. Under direct ultrasound visualization, the right greater saphenous vein was punctured with a 21 gauge micropuncture needle. A micropuncture sheath was inserted and exchanged over a Rosen wire for an 8 [place] vascular sheath. Next, 2 Perclose devices were deployed at the 10 o'clock and 2 o'clock positions in pre close fashion. Serial dilation was performed over a Wholey wire followed by placement of a 16 French, 33 cm dry seal sheath. In a coaxial fashion, a 7 [place], 70 cm Ansel sheath was then placed with its tip positioned at the level of the right atrium. A 6 French angled pigtail catheter was then directed from the right atrium through the right ventricle and into the main pulmonary artery with ease. Main pulmonary  artery pressures were then measured and recorded as 77/40 mmHg, mean 55 mmHg. Pulmonary angiogram was performed which demonstrated adequate perfusion to the right lung with perfusional defect in the left mid to lower lobes compatible with known large distal main left pulmonary artery thrombus. The pigtail catheter and 7 French sheath were then removed over the [facility] wire and the 16 French Penumbra aspiration catheter and 6 [place] select catheter were introduced in coaxial fashion to the level of the main pulmonary artery. Over the [facility] wire, the select catheter followed by the aspiration catheter were then directed into the left inferior pulmonary artery. Aspiration thrombectomy was then performed with yield of large clot burden in the aspiration canister with estimated blood loss of approximately 20 mL. The aspiration catheter was retracted to the level of the main pulmonary artery and pulmonary manometry was repeated and recorded as 68/20 mmHg, mean 35 mmHg. Pulmonary angiogram was repeated which demonstrated significantly improved perfusion of the left lung. The has pre shin catheter was removed. Attention was then turned toward the left lower extremity deep vein thrombosis. The 16 French sheath was retracted to the level of the iliac vein confluence. Using an Omni Flush catheter and Glidewire, left common, external, and femoral arteries were selected with moderate difficulty. The catheter was exchanged for a 6 [place], angled catheter and left lower extremity venogram was performed. Venogram was significant for patent left common iliac vein with extensive thrombus burden throughout the left external iliac and common femoral veins extending to the central aspect of the superficial femoral vein. The mid and peripheral superficial femoral veins were widely patent. Aspiration thrombectomy was again performed with the 16 French Penumbra aspiration catheter from the level of the external iliac vein through the  central superficial femoral vein. This required multiple catheter exchanges with thick,  subacute appearing thrombus that repeatedly clogged the aspiration catheter. After multiple passes, there was large volume of acute to subacute appearing thrombus within the aspiration canister. Repeat left lower extremity venogram demonstrated persistent thrombus in the region of the left common femoral vein. Therefore, balloon angioplasty was performed with a 10 mm by 8 cm Athletis balloon. Repeat venogram demonstrated mild improvement of the external iliac vein with persistent thrombus in the common femoral vein. Given resistance to aspiration thrombectomy and balloon maceration, a total of 8 mg tPA was administered into the thrombus via 6 French angled tip catheter. Next, mechanical thrombectomy was performed with the cleaner device with multiple passes. Repeat left lower extremity venogram demonstrated persistent irregular stenosis in the common femoral vein. Therefore, balloon angioplasty was performed in multiple stations with a 14 mm x 4 cm atlas balloon from the central superficial femoral vein through the external iliac vein. Completion venogram demonstrated significantly improved patency and brisk antegrade flow with persistent wall adherent filling defects throughout the common femoral vein, likely chronic thrombus. The decision was made to stop at this point. The catheters and sheath were removed over a wire and the Perclose devices were tied without complication. Hemostasis was achieved immediately. Sterile bandage was applied. A Ted hose was placed in the left lower extremity and a therapeutic single dose of Lovenox was administered in the IR suite. After updating the family that the procedure well, was notified by the IR staff that the patient decompensated prior to being transferred to the recovery room with Anesthesia. The patient experienced an episode of bradycardia and agonal breathing therefore the decision  was made by the anesthesia team to intubate the patient and began resuscitating. A notified the family this change and discuss the option of placing additional thrombolysis catheters as we suspected a possible etiology of hemodynamic decompensation being related to further development of pulmonary embolism after lower extremity intervention. The family was amenable. The bilateral necks were prepped and draped in standard fashion. As per request of the anesthesia team, a central line was necessary for continued resuscitation. Therefore, preprocedure ultrasound evaluation of the left neck demonstrated a patent and compressible left internal jugular vein. The procedure was planned. Subdermal Local anesthesia was provided with 1% lidocaine. A small skin nick was made. Under direct ultrasound visualization, the left internal jugular chain was punctured with a 21 gauge micropuncture needle. A permanent image was captured and stored in the record. a micropuncture sheath was introduced and an 035 J wire was directed to the level of the right atrium. After serial dilation, a 9 French triple-lumen central venous catheter was placed with the catheter tip near the cavoatrial junction. The lumens flushed and aspirated without difficulty. The catheter was secured to the neck with an interrupted 0 silk suture and a sterile bandage was applied. The anesthesia team began to use the catheter immediately. Preprocedure evaluation of the right internal jugular vein demonstrated patent see of the right internal jugular vein without evidence of thrombus. The procedure was planned. Subdermal Local anesthesia was provided with 1% lidocaine at the planned needle entry sites. Two small skin nicks were made. Under direct ultrasound visualization, a 21 gauge micropuncture needle was used to puncture the right internal jugular vein in 2 separate locations. Permanent images were captured and stored in the record. Micropuncture sheath was  introduced followed by placement of a Rosen wire to the level of the inferior vena cava at each access site. Through 1 of the access sites, the wire was  retracted after placement of an angled pigtail catheter and the pigtail catheter was directed through the right atrium, right ventricle, and into the right pulmonary artery. Limited pulmonary angiogram was performed to confirm location within a segmental pulmonary artery branch in the right inferior lobe. Over a Rosen wire, the catheter was removed and exchanged for a 90 cm, 10 cm infusion length UniFuse catheter. The identical procedure was performed with through the other 6 French jugular sheath to catheterize the left pulmonary artery in place a similar catheter. The catheters were affixed at the skin entry sites and marked with laterality. TPA infusions were connected and initiated. Sterile bandages were applied to the catheters and sheaths. Unfortunately, prior to transfer to the recovery room, the patient experienced cardiac arrest. Anesthesia colleagues, pulmonary critical care, and pharmacy worked to resuscitate the patient achieving ROSC multiple times. The family was updated about the sternal events and were invited to the interventional radiology suite. After over an hour of resuscitation, the patient continued to decompensate and could not maintain pulse respiration independently. The decision was made interdisciplinary and with the family to discontinue efforts. The patient expired in the interventional radiology suite. IMPRESSION: *Technically successful left pulmonary artery aspiration thrombectomy with decrease of mean pulmonary artery pressure from 55 to 35. 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 *Due to patient decompensation in the Interventional Radiology suite, a left  internal jugular central venous catheter was placed emergently. *Additionally, emergent catheterization and placement of bilateral pulmonary artery thrombolysis infusion catheters was performed and thrombolysis was initiated. Marliss Coots, MD [facility] Radiology Electronically Signed   By: Marliss Coots M.D.   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 EXAM: 1. Ultrasound-guided vascular access of the right greater saphenous vein. 2. Selective catheterization and angiography of the pulmonary arteries. 3. Pulmonary manometry. 4. Aspiration thrombectomy of the left pulmonary artery. 5. Selective catheterization and venography of the left iliofemoral veins. 6. Aspiration thrombectomy of the left external iliac, common femoral, and central femoral veins. 7. Pharmacomechanical thrombolysis of the left external iliac and common femoral veins. 8. Balloon angioplasty of the left external iliac, common femoral, and central femoral veins. 9. Ultrasound-guided vascular access of the left internal jugular vein. 10. Central venous catheter placement. 11. Ultrasound-guided vascular access of the right internal jugular vein x2. 12. Placement of bilateral pulmonary artery thrombolysis catheters with initiation of catheter directed thrombolysis. COMPARISON:  12-29-2022, 01/19/2023 MEDICATIONS: 8 mg tPA administered into the left common femoral vein ANESTHESIA/SEDATION: The patient was under the care of the department of anesthesia throughout the procedure. Please refer to the anesthesia record for further details. FLUOROSCOPY TIME:  One thousand two hundred seventy-five mGy COMPLICATIONS: SIR LEVEL F - Death. TECHNIQUE: This  procedure was performed with assistance by my partner, Dr. Mauri Reading Mir. Informed written consent was obtained from the patient and patient's family after a thorough discussion of the procedural risks, benefits and alternatives. All questions were addressed. Maximal Sterile Barrier Technique was utilized including caps, mask, sterile gowns, sterile gloves, sterile drape, hand hygiene and skin antiseptic. A timeout was performed prior to the initiation of the procedure. The patient was placed supine on the IR  table in the right groin was prepped and draped in standard fashion. Preprocedure ultrasound evaluation demonstrated patency of the right common femoral and rate greater saphenous veins. The procedure was planned. Subdermal Local anesthesia was administered at the planned needle entry site. A small skin nick was made. Under direct ultrasound visualization, the right greater saphenous vein was punctured with a 21 gauge micropuncture needle. A micropuncture sheath was inserted and exchanged over a Rosen wire for an 8 [place] vascular sheath. Next, 2 Perclose devices were deployed at the 10 o'clock and 2 o'clock positions in pre close fashion. Serial dilation was performed over a Wholey wire followed by placement of a 16 French, 33 cm dry seal sheath. In a coaxial fashion, a 7 [place], 70 cm Ansel sheath was then placed with its tip positioned at the level of the right atrium. A 6 French angled pigtail catheter was then directed from the right atrium through the right ventricle and into the main pulmonary artery with ease. Main pulmonary artery pressures were then measured and recorded as 77/40 mmHg, mean 55 mmHg. Pulmonary angiogram was performed which demonstrated adequate perfusion to the right lung with perfusional defect in the left mid to lower lobes compatible with known large distal main left pulmonary artery thrombus. The pigtail catheter and 7 French sheath were then removed over the [facility] wire and the 16  French Penumbra aspiration catheter and 6 [place] select catheter were introduced in coaxial fashion to the level of the main pulmonary artery. Over the [facility] wire, the select catheter followed by the aspiration catheter were then directed into the left inferior pulmonary artery. Aspiration thrombectomy was then performed with yield of large clot burden in the aspiration canister with estimated blood loss of approximately 20 mL. The aspiration catheter was retracted to the level of the main pulmonary artery and pulmonary manometry was repeated and recorded as 68/20 mmHg, mean 35 mmHg. Pulmonary angiogram was repeated which demonstrated significantly improved perfusion of the left lung. The has pre shin catheter was removed. Attention was then turned toward the left lower extremity deep vein thrombosis. The 16 French sheath was retracted to the level of the iliac vein confluence. Using an Omni Flush catheter and Glidewire, left common, external, and femoral arteries were selected with moderate difficulty. The catheter was exchanged for a 6 [place], angled catheter and left lower extremity venogram was performed. Venogram was significant for patent left common iliac vein with extensive thrombus burden throughout the left external iliac and common femoral veins extending to the central aspect of the superficial femoral vein. The mid and peripheral superficial femoral veins were widely patent. Aspiration thrombectomy was again performed with the 16 French Penumbra aspiration catheter from the level of the external iliac vein through the central superficial femoral vein. This required multiple catheter exchanges with thick, subacute appearing thrombus that repeatedly clogged the aspiration catheter. After multiple passes, there was large volume of acute to subacute appearing thrombus within the aspiration canister. Repeat left lower extremity venogram demonstrated persistent thrombus in the region of the left common  femoral vein. Therefore, balloon angioplasty was performed with a 10 mm by 8 cm Athletis balloon. Repeat venogram demonstrated mild improvement of the external iliac vein with persistent thrombus in the common femoral vein. Given resistance to aspiration thrombectomy and balloon maceration, a total of 8 mg tPA was administered into the thrombus via 6 French angled tip catheter. Next, mechanical thrombectomy was performed with the cleaner device with multiple passes. Repeat left lower extremity  venogram demonstrated persistent irregular stenosis in the common femoral vein. Therefore, balloon angioplasty was performed in multiple stations with a 14 mm x 4 cm atlas balloon from the central superficial femoral vein through the external iliac vein. Completion venogram demonstrated significantly improved patency and brisk antegrade flow with persistent wall adherent filling defects throughout the common femoral vein, likely chronic thrombus. The decision was made to stop at this point. The catheters and sheath were removed over a wire and the Perclose devices were tied without complication. Hemostasis was achieved immediately. Sterile bandage was applied. A Ted hose was placed in the left lower extremity and a therapeutic single dose of Lovenox was administered in the IR suite. After updating the family that the procedure well, was notified by the IR staff that the patient decompensated prior to being transferred to the recovery room with Anesthesia. The patient experienced an episode of bradycardia and agonal breathing therefore the decision was made by the anesthesia team to intubate the patient and began resuscitating. A notified the family this change and discuss the option of placing additional thrombolysis catheters as we suspected a possible etiology of hemodynamic decompensation being related to further development of pulmonary embolism after lower extremity intervention. The family was amenable. The bilateral  necks were prepped and draped in standard fashion. As per request of the anesthesia team, a central line was necessary for continued resuscitation. Therefore, preprocedure ultrasound evaluation of the left neck demonstrated a patent and compressible left internal jugular vein. The procedure was planned. Subdermal Local anesthesia was provided with 1% lidocaine. A small skin nick was made. Under direct ultrasound visualization, the left internal jugular chain was punctured with a 21 gauge micropuncture needle. A permanent image was captured and stored in the record. a micropuncture sheath was introduced and an 035 J wire was directed to the level of the right atrium. After serial dilation, a 9 French triple-lumen central venous catheter was placed with the catheter tip near the cavoatrial junction. The lumens flushed and aspirated without difficulty. The catheter was secured to the neck with an interrupted 0 silk suture and a sterile bandage was applied. The anesthesia team began to use the catheter immediately. Preprocedure evaluation of the right internal jugular vein demonstrated patent see of the right internal jugular vein without evidence of thrombus. The procedure was planned. Subdermal Local anesthesia was provided with 1% lidocaine at the planned needle entry sites. Two small skin nicks were made. Under direct ultrasound visualization, a 21 gauge micropuncture needle was used to puncture the right internal jugular vein in 2 separate locations. Permanent images were captured and stored in the record. Micropuncture sheath was introduced followed by placement of a Rosen wire to the level of the inferior vena cava at each access site. Through 1 of the access sites, the wire was retracted after placement of an angled pigtail catheter and the pigtail catheter was directed through the right atrium, right ventricle, and into the right pulmonary artery. Limited pulmonary angiogram was performed to confirm location  within a segmental pulmonary artery branch in the right inferior lobe. Over a Rosen wire, the catheter was removed and exchanged for a 90 cm, 10 cm infusion length UniFuse catheter. The identical procedure was performed with through the other 6 French jugular sheath to catheterize the left pulmonary artery in place a similar catheter. The catheters were affixed at the skin entry sites and marked with laterality. TPA infusions were connected and initiated. Sterile bandages were applied to the catheters and  sheaths. Unfortunately, prior to transfer to the recovery room, the patient experienced cardiac arrest. Anesthesia colleagues, pulmonary critical care, and pharmacy worked to resuscitate the patient achieving ROSC multiple times. The family was updated about the sternal events and were invited to the interventional radiology suite. After over an hour of resuscitation, the patient continued to decompensate and could not maintain pulse respiration independently. The decision was made interdisciplinary and with the family to discontinue efforts. The patient expired in the interventional radiology suite. IMPRESSION: *Technically successful left pulmonary artery aspiration thrombectomy with decrease of mean pulmonary artery pressure from 55 to 35. 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 *Due to patient decompensation in the Interventional Radiology suite, a left internal jugular central venous catheter was placed emergently. *Additionally, emergent catheterization and placement of bilateral pulmonary artery thrombolysis infusion catheters was performed and thrombolysis was initiated. Marliss Coots, MD [facility] Radiology Electronically Signed   By: Marliss Coots M.D.   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 EXAM: 1. Ultrasound-guided vascular access of the right greater saphenous vein. 2. Selective catheterization and angiography of the pulmonary arteries. 3. Pulmonary manometry. 4. Aspiration thrombectomy of the left pulmonary artery. 5. Selective catheterization and venography of the left iliofemoral veins. 6. Aspiration thrombectomy of the left external iliac, common femoral, and central femoral veins. 7. Pharmacomechanical thrombolysis of the left external iliac and common femoral veins. 8. Balloon angioplasty of the left external iliac, common femoral, and central femoral veins. 9. Ultrasound-guided vascular access of the left internal jugular vein. 10. Central venous catheter placement. 11. Ultrasound-guided vascular access of the right internal jugular vein x2. 12. Placement of bilateral pulmonary artery thrombolysis catheters with initiation of catheter directed thrombolysis. COMPARISON:  12-29-22, 01/15/2023 MEDICATIONS: 8 mg tPA administered into the left common femoral vein ANESTHESIA/SEDATION: The patient was under the care of the department of anesthesia throughout the procedure. Please refer to the anesthesia record for further details. FLUOROSCOPY TIME:  One thousand two hundred seventy-five mGy COMPLICATIONS: SIR LEVEL F - Death. TECHNIQUE: This procedure was performed with assistance by my partner, Dr. Mauri Reading Mir. Informed written consent was obtained from the patient and patient's family after a thorough discussion of the procedural risks, benefits and alternatives. All questions were addressed. Maximal Sterile Barrier Technique was utilized including caps, mask, sterile gowns, sterile gloves, sterile drape, hand hygiene and skin antiseptic.  A timeout was performed prior to the initiation of the procedure. The patient was placed supine on the IR table in the right groin was prepped and draped in standard fashion. Preprocedure ultrasound evaluation demonstrated patency of the right common femoral and rate greater saphenous veins. The procedure was planned. Subdermal Local anesthesia was administered at the planned needle entry site. A small skin nick was made. Under direct ultrasound visualization, the right greater saphenous vein was punctured with a 21 gauge micropuncture needle. A micropuncture sheath was inserted and exchanged over a Rosen wire for an 8 [place] vascular sheath. Next, 2 Perclose devices were deployed at the 10 o'clock and 2 o'clock positions in pre close fashion. Serial dilation was performed over a Wholey wire followed by placement of a 16 [place], 33  cm dry seal sheath. In a coaxial fashion, a 7 [place], 70 cm Ansel sheath was then placed with its tip positioned at the level of the right atrium. A 6 French angled pigtail catheter was then directed from the right atrium through the right ventricle and into the main pulmonary artery with ease. Main pulmonary artery pressures were then measured and recorded as 77/40 mmHg, mean 55 mmHg. Pulmonary angiogram was performed which demonstrated adequate perfusion to the right lung with perfusional defect in the left mid to lower lobes compatible with known large distal main left pulmonary artery thrombus. The pigtail catheter and 7 French sheath were then removed over the [facility] wire and the 16 French Penumbra aspiration catheter and 6 [place] select catheter were introduced in coaxial fashion to the level of the main pulmonary artery. Over the [facility] wire, the select catheter followed by the aspiration catheter were then directed into the left inferior pulmonary artery. Aspiration thrombectomy was then performed with yield of large clot burden in the aspiration canister with estimated blood loss  of approximately 20 mL. The aspiration catheter was retracted to the level of the main pulmonary artery and pulmonary manometry was repeated and recorded as 68/20 mmHg, mean 35 mmHg. Pulmonary angiogram was repeated which demonstrated significantly improved perfusion of the left lung. The has pre shin catheter was removed. Attention was then turned toward the left lower extremity deep vein thrombosis. The 16 French sheath was retracted to the level of the iliac vein confluence. Using an Omni Flush catheter and Glidewire, left common, external, and femoral arteries were selected with moderate difficulty. The catheter was exchanged for a 6 [place], angled catheter and left lower extremity venogram was performed. Venogram was significant for patent left common iliac vein with extensive thrombus burden throughout the left external iliac and common femoral veins extending to the central aspect of the superficial femoral vein. The mid and peripheral superficial femoral veins were widely patent. Aspiration thrombectomy was again performed with the 16 French Penumbra aspiration catheter from the level of the external iliac vein through the central superficial femoral vein. This required multiple catheter exchanges with thick, subacute appearing thrombus that repeatedly clogged the aspiration catheter. After multiple passes, there was large volume of acute to subacute appearing thrombus within the aspiration canister. Repeat left lower extremity venogram demonstrated persistent thrombus in the region of the left common femoral vein. Therefore, balloon angioplasty was performed with a 10 mm by 8 cm Athletis balloon. Repeat venogram demonstrated mild improvement of the external iliac vein with persistent thrombus in the common femoral vein. Given resistance to aspiration thrombectomy and balloon maceration, a total of 8 mg tPA was administered into the thrombus via 6 French angled tip catheter. Next, mechanical thrombectomy was  performed with the cleaner device with multiple passes. Repeat left lower extremity venogram demonstrated persistent irregular stenosis in the common femoral vein. Therefore, balloon angioplasty was performed in multiple stations with a 14 mm x 4 cm atlas balloon from the central superficial femoral vein through the external iliac vein. Completion venogram demonstrated significantly improved patency and brisk antegrade flow with persistent wall adherent filling defects throughout the common femoral vein, likely chronic thrombus. The decision was made to stop at this point. The catheters and sheath were removed over a wire and the Perclose devices were tied without complication. Hemostasis was achieved immediately. Sterile bandage was applied. A Ted hose was placed in the left lower extremity and a therapeutic single dose of Lovenox was administered in  the IR suite. After updating the family that the procedure well, was notified by the IR staff that the patient decompensated prior to being transferred to the recovery room with Anesthesia. The patient experienced an episode of bradycardia and agonal breathing therefore the decision was made by the anesthesia team to intubate the patient and began resuscitating. A notified the family this change and discuss the option of placing additional thrombolysis catheters as we suspected a possible etiology of hemodynamic decompensation being related to further development of pulmonary embolism after lower extremity intervention. The family was amenable. The bilateral necks were prepped and draped in standard fashion. As per request of the anesthesia team, a central line was necessary for continued resuscitation. Therefore, preprocedure ultrasound evaluation of the left neck demonstrated a patent and compressible left internal jugular vein. The procedure was planned. Subdermal Local anesthesia was provided with 1% lidocaine. A small skin nick was made. Under direct ultrasound  visualization, the left internal jugular chain was punctured with a 21 gauge micropuncture needle. A permanent image was captured and stored in the record. a micropuncture sheath was introduced and an 035 J wire was directed to the level of the right atrium. After serial dilation, a 9 French triple-lumen central venous catheter was placed with the catheter tip near the cavoatrial junction. The lumens flushed and aspirated without difficulty. The catheter was secured to the neck with an interrupted 0 silk suture and a sterile bandage was applied. The anesthesia team began to use the catheter immediately. Preprocedure evaluation of the right internal jugular vein demonstrated patent see of the right internal jugular vein without evidence of thrombus. The procedure was planned. Subdermal Local anesthesia was provided with 1% lidocaine at the planned needle entry sites. Two small skin nicks were made. Under direct ultrasound visualization, a 21 gauge micropuncture needle was used to puncture the right internal jugular vein in 2 separate locations. Permanent images were captured and stored in the record. Micropuncture sheath was introduced followed by placement of a Rosen wire to the level of the inferior vena cava at each access site. Through 1 of the access sites, the wire was retracted after placement of an angled pigtail catheter and the pigtail catheter was directed through the right atrium, right ventricle, and into the right pulmonary artery. Limited pulmonary angiogram was performed to confirm location within a segmental pulmonary artery branch in the right inferior lobe. Over a Rosen wire, the catheter was removed and exchanged for a 90 cm, 10 cm infusion length UniFuse catheter. The identical procedure was performed with through the other 6 French jugular sheath to catheterize the left pulmonary artery in place a similar catheter. The catheters were affixed at the skin entry sites and marked with laterality.  TPA infusions were connected and initiated. Sterile bandages were applied to the catheters and sheaths. Unfortunately, prior to transfer to the recovery room, the patient experienced cardiac arrest. Anesthesia colleagues, pulmonary critical care, and pharmacy worked to resuscitate the patient achieving ROSC multiple times. The family was updated about the sternal events and were invited to the interventional radiology suite. After over an hour of resuscitation, the patient continued to decompensate and could not maintain pulse respiration independently. The decision was made interdisciplinary and with the family to discontinue efforts. The patient expired in the interventional radiology suite. IMPRESSION: *Technically successful left pulmonary artery aspiration thrombectomy with decrease of mean pulmonary artery pressure from 55 to 35. 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 *Due to patient decompensation in the Interventional Radiology suite, a left internal jugular central venous catheter was placed emergently. *Additionally, emergent catheterization and placement of bilateral pulmonary artery thrombolysis infusion catheters was performed and thrombolysis was initiated. Marliss Coots, MD [facility] Radiology Electronically Signed   By: Marliss Coots M.D.   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 EXAM: 1. Ultrasound-guided vascular access of the right greater saphenous vein. 2. Selective catheterization and angiography of the pulmonary arteries. 3.  Pulmonary manometry. 4. Aspiration thrombectomy of the left pulmonary artery. 5. Selective catheterization and venography of the left iliofemoral veins. 6. Aspiration thrombectomy of the left external iliac, common femoral, and central femoral veins. 7. Pharmacomechanical thrombolysis of the left external iliac and common femoral veins. 8. Balloon angioplasty of the left external iliac, common femoral, and central femoral veins. 9. Ultrasound-guided vascular access of the left internal jugular vein. 10. Central venous catheter placement. 11. Ultrasound-guided vascular access of the right internal jugular vein x2. 12. Placement of bilateral pulmonary artery thrombolysis catheters with initiation of catheter directed thrombolysis. COMPARISON:  01-16-2023, 01/06/2023 MEDICATIONS: 8 mg tPA administered into the left common femoral vein ANESTHESIA/SEDATION: The patient was under the care of the department of anesthesia throughout the procedure. Please refer to the anesthesia record for further details. FLUOROSCOPY TIME:  One thousand two hundred seventy-five mGy COMPLICATIONS: SIR LEVEL F - Death. TECHNIQUE: This procedure was performed with assistance by my partner, Dr. Mauri Reading Mir. Informed written consent was obtained from the patient and patient's family after a thorough discussion of the procedural risks, benefits and alternatives. All questions were addressed. Maximal Sterile Barrier Technique was utilized including caps, mask, sterile gowns, sterile gloves, sterile drape, hand hygiene and skin antiseptic. A timeout was performed prior to the initiation of the procedure. The patient was placed supine on the IR table in the right groin was prepped and draped in standard fashion. Preprocedure ultrasound evaluation demonstrated patency of the right common femoral and rate greater saphenous veins. The procedure was planned. Subdermal Local anesthesia was administered at the planned needle entry site. A small skin nick  was made. Under direct ultrasound visualization, the right greater saphenous vein was punctured with a 21 gauge micropuncture needle. A micropuncture sheath was inserted and exchanged over a Rosen wire for an 8 [place] vascular sheath. Next, 2 Perclose devices were deployed at the 10 o'clock and 2 o'clock positions in pre close fashion. Serial dilation was performed over a Wholey wire followed by placement of a 16 French, 33 cm dry seal sheath. In a coaxial fashion, a 7 [place], 70 cm Ansel sheath was then placed with its tip positioned at the level of the right atrium. A 6 French angled pigtail catheter was then directed from the right atrium through the right ventricle and into the main pulmonary artery with ease. Main pulmonary artery pressures were then measured and recorded as 77/40 mmHg, mean 55 mmHg. Pulmonary angiogram was performed which demonstrated adequate perfusion to the right lung with perfusional defect in the left mid to lower lobes compatible with known large distal main left pulmonary artery thrombus. The pigtail catheter and 7 French sheath were then removed over the [facility] wire and  the 16 French Penumbra aspiration catheter and 6 [place] select catheter were introduced in coaxial fashion to the level of the main pulmonary artery. Over the [facility] wire, the select catheter followed by the aspiration catheter were then directed into the left inferior pulmonary artery. Aspiration thrombectomy was then performed with yield of large clot burden in the aspiration canister with estimated blood loss of approximately 20 mL. The aspiration catheter was retracted to the level of the main pulmonary artery and pulmonary manometry was repeated and recorded as 68/20 mmHg, mean 35 mmHg. Pulmonary angiogram was repeated which demonstrated significantly improved perfusion of the left lung. The has pre shin catheter was removed. Attention was then turned toward the left lower extremity deep vein thrombosis. The 16  French sheath was retracted to the level of the iliac vein confluence. Using an Omni Flush catheter and Glidewire, left common, external, and femoral arteries were selected with moderate difficulty. The catheter was exchanged for a 6 [place], angled catheter and left lower extremity venogram was performed. Venogram was significant for patent left common iliac vein with extensive thrombus burden throughout the left external iliac and common femoral veins extending to the central aspect of the superficial femoral vein. The mid and peripheral superficial femoral veins were widely patent. Aspiration thrombectomy was again performed with the 16 French Penumbra aspiration catheter from the level of the external iliac vein through the central superficial femoral vein. This required multiple catheter exchanges with thick, subacute appearing thrombus that repeatedly clogged the aspiration catheter. After multiple passes, there was large volume of acute to subacute appearing thrombus within the aspiration canister. Repeat left lower extremity venogram demonstrated persistent thrombus in the region of the left common femoral vein. Therefore, balloon angioplasty was performed with a 10 mm by 8 cm Athletis balloon. Repeat venogram demonstrated mild improvement of the external iliac vein with persistent thrombus in the common femoral vein. Given resistance to aspiration thrombectomy and balloon maceration, a total of 8 mg tPA was administered into the thrombus via 6 French angled tip catheter. Next, mechanical thrombectomy was performed with the cleaner device with multiple passes. Repeat left lower extremity venogram demonstrated persistent irregular stenosis in the common femoral vein. Therefore, balloon angioplasty was performed in multiple stations with a 14 mm x 4 cm atlas balloon from the central superficial femoral vein through the external iliac vein. Completion venogram demonstrated significantly improved patency and  brisk antegrade flow with persistent wall adherent filling defects throughout the common femoral vein, likely chronic thrombus. The decision was made to stop at this point. The catheters and sheath were removed over a wire and the Perclose devices were tied without complication. Hemostasis was achieved immediately. Sterile bandage was applied. A Ted hose was placed in the left lower extremity and a therapeutic single dose of Lovenox was administered in the IR suite. After updating the family that the procedure well, was notified by the IR staff that the patient decompensated prior to being transferred to the recovery room with Anesthesia. The patient experienced an episode of bradycardia and agonal breathing therefore the decision was made by the anesthesia team to intubate the patient and began resuscitating. A notified the family this change and discuss the option of placing additional thrombolysis catheters as we suspected a possible etiology of hemodynamic decompensation being related to further development of pulmonary embolism after lower extremity intervention. The family was amenable. The bilateral necks were prepped and draped in standard fashion. As per request of the anesthesia team, a central  line was necessary for continued resuscitation. Therefore, preprocedure ultrasound evaluation of the left neck demonstrated a patent and compressible left internal jugular vein. The procedure was planned. Subdermal Local anesthesia was provided with 1% lidocaine. A small skin nick was made. Under direct ultrasound visualization, the left internal jugular chain was punctured with a 21 gauge micropuncture needle. A permanent image was captured and stored in the record. a micropuncture sheath was introduced and an 035 J wire was directed to the level of the right atrium. After serial dilation, a 9 French triple-lumen central venous catheter was placed with the catheter tip near the cavoatrial junction. The lumens  flushed and aspirated without difficulty. The catheter was secured to the neck with an interrupted 0 silk suture and a sterile bandage was applied. The anesthesia team began to use the catheter immediately. Preprocedure evaluation of the right internal jugular vein demonstrated patent see of the right internal jugular vein without evidence of thrombus. The procedure was planned. Subdermal Local anesthesia was provided with 1% lidocaine at the planned needle entry sites. Two small skin nicks were made. Under direct ultrasound visualization, a 21 gauge micropuncture needle was used to puncture the right internal jugular vein in 2 separate locations. Permanent images were captured and stored in the record. Micropuncture sheath was introduced followed by placement of a Rosen wire to the level of the inferior vena cava at each access site. Through 1 of the access sites, the wire was retracted after placement of an angled pigtail catheter and the pigtail catheter was directed through the right atrium, right ventricle, and into the right pulmonary artery. Limited pulmonary angiogram was performed to confirm location within a segmental pulmonary artery branch in the right inferior lobe. Over a Rosen wire, the catheter was removed and exchanged for a 90 cm, 10 cm infusion length UniFuse catheter. The identical procedure was performed with through the other 6 French jugular sheath to catheterize the left pulmonary artery in place a similar catheter. The catheters were affixed at the skin entry sites and marked with laterality. TPA infusions were connected and initiated. Sterile bandages were applied to the catheters and sheaths. Unfortunately, prior to transfer to the recovery room, the patient experienced cardiac arrest. Anesthesia colleagues, pulmonary critical care, and pharmacy worked to resuscitate the patient achieving ROSC multiple times. The family was updated about the sternal events and were invited to the  interventional radiology suite. After over an hour of resuscitation, the patient continued to decompensate and could not maintain pulse respiration independently. The decision was made interdisciplinary and with the family to discontinue efforts. The patient expired in the interventional radiology suite. IMPRESSION: *Technically successful left pulmonary artery aspiration thrombectomy with decrease of mean pulmonary artery pressure from 55 to 35. 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 *Due to patient decompensation in the Interventional Radiology suite, a left internal jugular central venous catheter was placed emergently. *Additionally, emergent catheterization and placement of bilateral pulmonary artery thrombolysis infusion catheters was performed and thrombolysis was initiated. Marliss Coots, MD [facility] and Interventional Radiology Special[facility] Radiology Electronically Signed   By: Marliss Coots M.D.   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 EXAM: 1. Ultrasound-guided  vascular access of the right greater saphenous vein. 2. Selective catheterization and angiography of the pulmonary arteries. 3. Pulmonary manometry. 4. Aspiration thrombectomy of the left pulmonary artery. 5. Selective catheterization and venography of the left iliofemoral veins. 6. Aspiration thrombectomy of the left external iliac, common femoral, and central femoral veins. 7. Pharmacomechanical thrombolysis of the left external iliac and common femoral veins. 8. Balloon angioplasty of the left external  iliac, common femoral, and central femoral veins. 9. Ultrasound-guided vascular access of the left internal jugular vein. 10. Central venous catheter placement. 11. Ultrasound-guided vascular access of the right internal jugular vein x2. 12. Placement of bilateral pulmonary artery thrombolysis catheters with initiation of catheter directed thrombolysis. COMPARISON:  January 19, 2023, 01/01/2023 MEDICATIONS: 8 mg tPA administered into the left common femoral vein ANESTHESIA/SEDATION: The patient was under the care of the department of anesthesia throughout the procedure. Please refer to the anesthesia record for further details. FLUOROSCOPY TIME:  One thousand two hundred seventy-five mGy COMPLICATIONS: SIR LEVEL F - Death. TECHNIQUE: This procedure was performed with assistance by my partner, Dr. Mauri Reading Mir. Informed written consent was obtained from the patient and patient's family after a thorough discussion of the procedural risks, benefits and alternatives. All questions were addressed. Maximal Sterile Barrier Technique was utilized including caps, mask, sterile gowns, sterile gloves, sterile drape, hand hygiene and skin antiseptic. A timeout was performed prior to the initiation of the procedure. The patient was placed supine on the IR table in the right groin was prepped and draped in standard fashion. Preprocedure ultrasound evaluation demonstrated patency of the right common femoral and rate greater saphenous veins. The procedure was planned. Subdermal Local anesthesia was administered at the planned needle entry site. A small skin nick was made. Under direct ultrasound visualization, the right greater saphenous vein was punctured with a 21 gauge micropuncture needle. A micropuncture sheath was inserted and exchanged over a Rosen wire for an 8 [place] vascular sheath. Next, 2 Perclose devices were deployed at the 10 o'clock and 2 o'clock positions in pre close fashion. Serial dilation was performed over a Wholey  wire followed by placement of a 16 French, 33 cm dry seal sheath. In a coaxial fashion, a 7 [place], 70 cm Ansel sheath was then placed with its tip positioned at the level of the right atrium. A 6 French angled pigtail catheter was then directed from the right atrium through the right ventricle and into the main pulmonary artery with ease. Main pulmonary artery pressures were then measured and recorded as 77/40 mmHg, mean 55 mmHg. Pulmonary angiogram was performed which demonstrated adequate perfusion to the right lung with perfusional defect in the left mid to lower lobes compatible with known large distal main left pulmonary artery thrombus. The pigtail catheter and 7 French sheath were then removed over the [facility] wire and the 16 French Penumbra aspiration catheter and 6 [place] select catheter were introduced in coaxial fashion to the level of the main pulmonary artery. Over the [facility] wire, the select catheter followed by the aspiration catheter were then directed into the left inferior pulmonary artery. Aspiration thrombectomy was then performed with yield of large clot burden in the aspiration canister with estimated blood loss of approximately 20 mL. The aspiration catheter was retracted to the level of the main pulmonary artery and pulmonary manometry was repeated and recorded as 68/20 mmHg, mean 35 mmHg. Pulmonary angiogram was repeated which demonstrated significantly improved perfusion of the left lung. The has pre shin catheter was removed. Attention was  then turned toward the left lower extremity deep vein thrombosis. The 16 French sheath was retracted to the level of the iliac vein confluence. Using an Omni Flush catheter and Glidewire, left common, external, and femoral arteries were selected with moderate difficulty. The catheter was exchanged for a 6 [place], angled catheter and left lower extremity venogram was performed. Venogram was significant for patent left common iliac vein with extensive  thrombus burden throughout the left external iliac and common femoral veins extending to the central aspect of the superficial femoral vein. The mid and peripheral superficial femoral veins were widely patent. Aspiration thrombectomy was again performed with the 16 French Penumbra aspiration catheter from the level of the external iliac vein through the central superficial femoral vein. This required multiple catheter exchanges with thick, subacute appearing thrombus that repeatedly clogged the aspiration catheter. After multiple passes, there was large volume of acute to subacute appearing thrombus within the aspiration canister. Repeat left lower extremity venogram demonstrated persistent thrombus in the region of the left common femoral vein. Therefore, balloon angioplasty was performed with a 10 mm by 8 cm Athletis balloon. Repeat venogram demonstrated mild improvement of the external iliac vein with persistent thrombus in the common femoral vein. Given resistance to aspiration thrombectomy and balloon maceration, a total of 8 mg tPA was administered into the thrombus via 6 French angled tip catheter. Next, mechanical thrombectomy was performed with the cleaner device with multiple passes. Repeat left lower extremity venogram demonstrated persistent irregular stenosis in the common femoral vein. Therefore, balloon angioplasty was performed in multiple stations with a 14 mm x 4 cm atlas balloon from the central superficial femoral vein through the external iliac vein. Completion venogram demonstrated significantly improved patency and brisk antegrade flow with persistent wall adherent filling defects throughout the common femoral vein, likely chronic thrombus. The decision was made to stop at this point. The catheters and sheath were removed over a wire and the Perclose devices were tied without complication. Hemostasis was achieved immediately. Sterile bandage was applied. A Ted hose was placed in the left lower  extremity and a therapeutic single dose of Lovenox was administered in the IR suite. After updating the family that the procedure well, was notified by the IR staff that the patient decompensated prior to being transferred to the recovery room with Anesthesia. The patient experienced an episode of bradycardia and agonal breathing therefore the decision was made by the anesthesia team to intubate the patient and began resuscitating. A notified the family this change and discuss the option of placing additional thrombolysis catheters as we suspected a possible etiology of hemodynamic decompensation being related to further development of pulmonary embolism after lower extremity intervention. The family was amenable. The bilateral necks were prepped and draped in standard fashion. As per request of the anesthesia team, a central line was necessary for continued resuscitation. Therefore, preprocedure ultrasound evaluation of the left neck demonstrated a patent and compressible left internal jugular vein. The procedure was planned. Subdermal Local anesthesia was provided with 1% lidocaine. A small skin nick was made. Under direct ultrasound visualization, the left internal jugular chain was punctured with a 21 gauge micropuncture needle. A permanent image was captured and stored in the record. a micropuncture sheath was introduced and an 035 J wire was directed to the level of the right atrium. After serial dilation, a 9 French triple-lumen central venous catheter was placed with the catheter tip near the cavoatrial junction. The lumens flushed and aspirated without difficulty. The catheter  was secured to the neck with an interrupted 0 silk suture and a sterile bandage was applied. The anesthesia team began to use the catheter immediately. Preprocedure evaluation of the right internal jugular vein demonstrated patent see of the right internal jugular vein without evidence of thrombus. The procedure was planned. Subdermal  Local anesthesia was provided with 1% lidocaine at the planned needle entry sites. Two small skin nicks were made. Under direct ultrasound visualization, a 21 gauge micropuncture needle was used to puncture the right internal jugular vein in 2 separate locations. Permanent images were captured and stored in the record. Micropuncture sheath was introduced followed by placement of a Rosen wire to the level of the inferior vena cava at each access site. Through 1 of the access sites, the wire was retracted after placement of an angled pigtail catheter and the pigtail catheter was directed through the right atrium, right ventricle, and into the right pulmonary artery. Limited pulmonary angiogram was performed to confirm location within a segmental pulmonary artery branch in the right inferior lobe. Over a Rosen wire, the catheter was removed and exchanged for a 90 cm, 10 cm infusion length UniFuse catheter. The identical procedure was performed with through the other 6 French jugular sheath to catheterize the left pulmonary artery in place a similar catheter. The catheters were affixed at the skin entry sites and marked with laterality. TPA infusions were connected and initiated. Sterile bandages were applied to the catheters and sheaths. Unfortunately, prior to transfer to the recovery room, the patient experienced cardiac arrest. Anesthesia colleagues, pulmonary critical care, and pharmacy worked to resuscitate the patient achieving ROSC multiple times. The family was updated about the sternal events and were invited to the interventional radiology suite. After over an hour of resuscitation, the patient continued to decompensate and could not maintain pulse respiration independently. The decision was made interdisciplinary and with the family to discontinue efforts. The patient expired in the interventional radiology suite. IMPRESSION: *Technically successful left pulmonary artery aspiration thrombectomy with decrease  of mean pulmonary artery pressure from 55 to 35. 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 *Due to patient decompensation in the Interventional Radiology suite, a left internal jugular central venous catheter was placed emergently. *Additionally, emergent catheterization and placement of bilateral pulmonary artery thrombolysis infusion catheters was performed and thrombolysis was initiated. Marliss Coots, MD [facility] Radiology Electronically Signed   By: Marliss Coots M.D.   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 EXAM: 1. Ultrasound-guided vascular access of the right greater saphenous vein. 2. Selective catheterization and angiography of the pulmonary arteries. 3. Pulmonary manometry. 4. Aspiration thrombectomy of the left pulmonary artery. 5. Selective catheterization and venography of the left iliofemoral veins. 6. Aspiration thrombectomy of the left external iliac, common femoral, and central femoral veins. 7. Pharmacomechanical thrombolysis of the left external iliac and common femoral veins. 8. Balloon angioplasty of the left external iliac, common femoral, and central femoral veins. 9. Ultrasound-guided vascular access of the left internal jugular vein. 10. Central venous catheter placement. 11. Ultrasound-guided vascular access of the right internal jugular vein x2. 12. Placement of bilateral pulmonary artery thrombolysis catheters with initiation of catheter  directed thrombolysis. COMPARISON:  2023/01/20, 01/13/2023  MEDICATIONS: 8 mg tPA administered into the left common femoral vein ANESTHESIA/SEDATION: The patient was under the care of the department of anesthesia throughout the procedure. Please refer to the anesthesia record for further details. FLUOROSCOPY TIME:  One thousand two hundred seventy-five mGy COMPLICATIONS: SIR LEVEL F - Death. TECHNIQUE: This procedure was performed with assistance by my partner, Dr. Mauri Reading Mir. Informed written consent was obtained from the patient and patient's family after a thorough discussion of the procedural risks, benefits and alternatives. All questions were addressed. Maximal Sterile Barrier Technique was utilized including caps, mask, sterile gowns, sterile gloves, sterile drape, hand hygiene and skin antiseptic. A timeout was performed prior to the initiation of the procedure. The patient was placed supine on the IR table in the right groin was prepped and draped in standard fashion. Preprocedure ultrasound evaluation demonstrated patency of the right common femoral and rate greater saphenous veins. The procedure was planned. Subdermal Local anesthesia was administered at the planned needle entry site. A small skin nick was made. Under direct ultrasound visualization, the right greater saphenous vein was punctured with a 21 gauge micropuncture needle. A micropuncture sheath was inserted and exchanged over a Rosen wire for an 8 [place] vascular sheath. Next, 2 Perclose devices were deployed at the 10 o'clock and 2 o'clock positions in pre close fashion. Serial dilation was performed over a Wholey wire followed by placement of a 16 French, 33 cm dry seal sheath. In a coaxial fashion, a 7 [place], 70 cm Ansel sheath was then placed with its tip positioned at the level of the right atrium. A 6 French angled pigtail catheter was then directed from the right atrium through the right ventricle and into the main pulmonary artery with ease. Main pulmonary artery pressures were then measured  and recorded as 77/40 mmHg, mean 55 mmHg. Pulmonary angiogram was performed which demonstrated adequate perfusion to the right lung with perfusional defect in the left mid to lower lobes compatible with known large distal main left pulmonary artery thrombus. The pigtail catheter and 7 French sheath were then removed over the [facility] wire and the 16 French Penumbra aspiration catheter and 6 [place] select catheter were introduced in coaxial fashion to the level of the main pulmonary artery. Over the [facility] wire, the select catheter followed by the aspiration catheter were then directed into the left inferior pulmonary artery. Aspiration thrombectomy was then performed with yield of large clot burden in the aspiration canister with estimated blood loss of approximately 20 mL. The aspiration catheter was retracted to the level of the main pulmonary artery and pulmonary manometry was repeated and recorded as 68/20 mmHg, mean 35 mmHg. Pulmonary angiogram was repeated which demonstrated significantly improved perfusion of the left lung. The has pre shin catheter was removed. Attention was then turned toward the left lower extremity deep vein thrombosis. The 16 French sheath was retracted to the level of the iliac vein confluence. Using an Omni Flush catheter and Glidewire, left common, external, and femoral arteries were selected with moderate difficulty. The catheter was exchanged for a 6 [place], angled catheter and left lower extremity venogram was performed. Venogram was significant for patent left common iliac vein with extensive thrombus burden throughout the left external iliac and common femoral veins extending to the central aspect of the superficial femoral vein. The mid and peripheral superficial femoral veins were widely patent. Aspiration thrombectomy was again performed with the 16 French Penumbra aspiration catheter from the level  of the external iliac vein through the central superficial femoral vein. This  required multiple catheter exchanges with thick, subacute appearing thrombus that repeatedly clogged the aspiration catheter. After multiple passes, there was large volume of acute to subacute appearing thrombus within the aspiration canister. Repeat left lower extremity venogram demonstrated persistent thrombus in the region of the left common femoral vein. Therefore, balloon angioplasty was performed with a 10 mm by 8 cm Athletis balloon. Repeat venogram demonstrated mild improvement of the external iliac vein with persistent thrombus in the common femoral vein. Given resistance to aspiration thrombectomy and balloon maceration, a total of 8 mg tPA was administered into the thrombus via 6 French angled tip catheter. Next, mechanical thrombectomy was performed with the cleaner device with multiple passes. Repeat left lower extremity venogram demonstrated persistent irregular stenosis in the common femoral vein. Therefore, balloon angioplasty was performed in multiple stations with a 14 mm x 4 cm atlas balloon from the central superficial femoral vein through the external iliac vein. Completion venogram demonstrated significantly improved patency and brisk antegrade flow with persistent wall adherent filling defects throughout the common femoral vein, likely chronic thrombus. The decision was made to stop at this point. The catheters and sheath were removed over a wire and the Perclose devices were tied without complication. Hemostasis was achieved immediately. Sterile bandage was applied. A Ted hose was placed in the left lower extremity and a therapeutic single dose of Lovenox was administered in the IR suite. After updating the family that the procedure well, was notified by the IR staff that the patient decompensated prior to being transferred to the recovery room with Anesthesia. The patient experienced an episode of bradycardia and agonal breathing therefore the decision was made by the anesthesia team to  intubate the patient and began resuscitating. A notified the family this change and discuss the option of placing additional thrombolysis catheters as we suspected a possible etiology of hemodynamic decompensation being related to further development of pulmonary embolism after lower extremity intervention. The family was amenable. The bilateral necks were prepped and draped in standard fashion. As per request of the anesthesia team, a central line was necessary for continued resuscitation. Therefore, preprocedure ultrasound evaluation of the left neck demonstrated a patent and compressible left internal jugular vein. The procedure was planned. Subdermal Local anesthesia was provided with 1% lidocaine. A small skin nick was made. Under direct ultrasound visualization, the left internal jugular chain was punctured with a 21 gauge micropuncture needle. A permanent image was captured and stored in the record. a micropuncture sheath was introduced and an 035 J wire was directed to the level of the right atrium. After serial dilation, a 9 French triple-lumen central venous catheter was placed with the catheter tip near the cavoatrial junction. The lumens flushed and aspirated without difficulty. The catheter was secured to the neck with an interrupted 0 silk suture and a sterile bandage was applied. The anesthesia team began to use the catheter immediately. Preprocedure evaluation of the right internal jugular vein demonstrated patent see of the right internal jugular vein without evidence of thrombus. The procedure was planned. Subdermal Local anesthesia was provided with 1% lidocaine at the planned needle entry sites. Two small skin nicks were made. Under direct ultrasound visualization, a 21 gauge micropuncture needle was used to puncture the right internal jugular vein in 2 separate locations. Permanent images were captured and stored in the record. Micropuncture sheath was introduced followed by placement of a Rosen  wire to the  level of the inferior vena cava at each access site. Through 1 of the access sites, the wire was retracted after placement of an angled pigtail catheter and the pigtail catheter was directed through the right atrium, right ventricle, and into the right pulmonary artery. Limited pulmonary angiogram was performed to confirm location within a segmental pulmonary artery branch in the right inferior lobe. Over a Rosen wire, the catheter was removed and exchanged for a 90 cm, 10 cm infusion length UniFuse catheter. The identical procedure was performed with through the other 6 French jugular sheath to catheterize the left pulmonary artery in place a similar catheter. The catheters were affixed at the skin entry sites and marked with laterality. TPA infusions were connected and initiated. Sterile bandages were applied to the catheters and sheaths. Unfortunately, prior to transfer to the recovery room, the patient experienced cardiac arrest. Anesthesia colleagues, pulmonary critical care, and pharmacy worked to resuscitate the patient achieving ROSC multiple times. The family was updated about the sternal events and were invited to the interventional radiology suite. After over an hour of resuscitation, the patient continued to decompensate and could not maintain pulse respiration independently. The decision was made interdisciplinary and with the family to discontinue efforts. The patient expired in the interventional radiology suite. IMPRESSION: *Technically successful left pulmonary artery aspiration thrombectomy with decrease of mean pulmonary artery pressure from 55 to 35. 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 *Due to patient decompensation in the Interventional Radiology suite, a left internal jugular central venous catheter was  placed emergently. *Additionally, emergent catheterization and placement of bilateral pulmonary artery thrombolysis infusion catheters was performed and thrombolysis was initiated. Marliss Coots, MD [facility] Radiology Electronically Signed   By: Marliss Coots M.D.   On: 12/25/2022 11:30   CT Angio Chest Pulmonary Embolism (PE) W or WO Contrast  Result Date: 01/08/23 CLINICAL DATA:  Follow up PE EXAM: CT ANGIOGRAPHY CHEST WITH CONTRAST TECHNIQUE: Multidetector CT imaging of the chest was performed using the standard protocol during bolus administration of intravenous contrast. Multiplanar CT image reconstructions and MIPs were obtained to evaluate the vascular anatomy. RADIATION DOSE REDUCTION: This exam was performed according to the departmental dose-optimization program which includes automated exposure control, adjustment of the mA and/or kV according to patient size and/or use of iterative reconstruction technique. CONTRAST:  75mL OMNIPAQUE IOHEXOL 350 MG/ML SOLN COMPARISON:  12/21/2022 FINDINGS: Cardiovascular: Large bilateral pulmonary emboli again noted. No definite interval change when compared to the prior study. Trace pericardial effusion or pericardial thickening. Mild cardiomegaly. Right ventricular enlargement consistent with right heart strain. Contrast reflux into IVC consistent with tricuspid insufficiency. Mediastinum/Nodes: Right paratracheal adenopathy, 1.4 cm. Anterior mediastinal soft tissue consistent with thymic rebound. Lungs/Pleura: Diffuse ground-glass opacity consistent with pneumonitis or pulmonary edema. In the setting of PE hemorrhage related to infarction should be considered. Bilateral pleural effusions, larger on the left. No pneumothorax. Upper Abdomen: No acute abnormality. Musculoskeletal: No chest wall abnormality. No acute or significant osseous findings. Review of the MIP images confirms the above findings. IMPRESSION: 1.  Large bilateral pulmonary emboli with right heart strain again noted. 2. Increasing bilateral pleural effusions. 3. Diffuse ground-glass opacity consistent with pneumonitis, edema or hemorrhage. 4. Stable mediastinal adenopathy. Electronically Signed   By: Layla Maw M.D.   On: 01/08/23 11:24   ECHOCARDIOGRAM COMPLETE  Result Date: 12/22/2022    ECHOCARDIOGRAM  REPORT   Patient Name:   KARLOS MALINA IV Date of Exam: 12/22/2022 Medical Rec #:  [id number]           Height:       61.0 in Accession #:    [id number]          Weight:       194.2 lb Date of Birth:  1990/12/02           BSA:          1.865 m Patient Age:    31 years            BP:           84/62 mmHg Patient Gender: M                   HR:           91 bpm. Exam Location:  Inpatient Procedure: 2D Echo, Cardiac Doppler and Color Doppler Indications:    Pulmonary Embolus I26.09  History:        Patient has no prior history of Echocardiogram examinations.                 Pulmonary Emboulus; Lower extremity DVT,                 Signs/Symptoms:Shortness of Breath; Risk Factors:Non-Smoker.  Sonographer:    Dondra Prader RVT RCS Referring Phys: [id number] Charlotte Sanes  Sonographer Comments: Technically challenging study due to limited acoustic windows, Technically difficult study due to poor echo windows, suboptimal apical window, suboptimal subcostal window, suboptimal parasternal window and patient is obese. Image acquisition challenging due to patient body habitus, Image acquisition challenging due to uncooperative patient and Image acquisition challenging due to respiratory motion. Unable to administer Definity due to uncooperative patient and refuses breath hold. IMPRESSIONS  1. Left ventricular ejection fraction, by estimation, is 60 to 65%. The left ventricle has normal function. Left ventricular endocardial border not optimally defined to evaluate regional wall motion. Left ventricular diastolic parameters were normal.  2. Ventricular septum is  flattened in systole suggesting RV pressure overload. . Right ventricular systolic function is normal. The right ventricular size is mildly enlarged. Tricuspid regurgitation signal is inadequate for assessing PA pressure.  3. The mitral valve is normal in structure. No evidence of mitral valve regurgitation. No evidence of mitral stenosis.  4. The aortic valve has an indeterminant number of cusps. Aortic valve regurgitation is not visualized. No aortic stenosis is present. FINDINGS  Left Ventricle: Left ventricular ejection fraction, by estimation, is 60 to 65%. The left ventricle has normal function. Left ventricular endocardial border not optimally defined to evaluate regional wall motion. The left ventricular internal cavity size was normal in size. There is no left ventricular hypertrophy. Left ventricular diastolic parameters were normal. Right Ventricle: Ventricular septum is flattened in systole suggesting RV pressure overload. The right ventricular size is mildly enlarged. Right vetricular wall thickness was not well visualized. Right ventricular systolic function is normal. Tricuspid regurgitation signal is inadequate for assessing PA pressure. Left Atrium: Left atrial size was normal in size. Right Atrium: Right atrial size was not well visualized. Pericardium: There is no evidence of pericardial effusion. Mitral Valve: The mitral valve is normal in structure. No evidence of mitral valve regurgitation. No evidence of mitral valve stenosis. Tricuspid Valve: The tricuspid valve is normal in structure. Tricuspid valve regurgitation is not demonstrated. No evidence of tricuspid stenosis. Aortic Valve: The aortic valve has an indeterminant  number of cusps. Aortic valve regurgitation is not visualized. No aortic stenosis is present. Aortic valve mean gradient measures 3.0 mmHg. Aortic valve peak gradient measures 5.4 mmHg. Aortic valve area, by VTI measures 2.36 cm. Pulmonic Valve: The pulmonic valve was not  well visualized. Pulmonic valve regurgitation is not visualized. No evidence of pulmonic stenosis. Aorta: The aortic root is normal in size and structure. IAS/Shunts: No atrial level shunt detected by color flow Doppler.  LEFT VENTRICLE PLAX 2D LVIDd:         4.10 cm   Diastology LVIDs:         2.40 cm   LV e' medial:    8.65 cm/s LV PW:         1.00 cm   LV E/e' medial:  8.8 LV IVS:        0.70 cm   LV e' lateral:   14.20 cm/s LVOT diam:     1.70 cm   LV E/e' lateral: 5.4 LV SV:         40 LV SV Index:   22 LVOT Area:     2.27 cm  RIGHT VENTRICLE             IVC RV Basal diam:  3.70 cm     IVC diam: 1.40 cm RV S prime:     17.10 cm/s TAPSE (M-mode): 2.6 cm LEFT ATRIUM           Index        RIGHT ATRIUM           Index LA diam:      2.50 cm 1.34 cm/m   RA Area:     11.70 cm LA Vol (A2C): 10.5 ml 5.63 ml/m   RA Volume:   26.80 ml  14.37 ml/m LA Vol (A4C): 20.1 ml 10.78 ml/m  AORTIC VALVE                    PULMONIC VALVE AV Area (Vmax):    2.17 cm     PV Vmax:       0.96 m/s AV Area (Vmean):   2.14 cm     PV Peak grad:  3.7 mmHg AV Area (VTI):     2.36 cm AV Vmax:           116.00 cm/s AV Vmean:          73.100 cm/s AV VTI:            0.171 m AV Peak Grad:      5.4 mmHg AV Mean Grad:      3.0 mmHg LVOT Vmax:         111.00 cm/s LVOT Vmean:        68.800 cm/s LVOT VTI:          0.178 m LVOT/AV VTI ratio: 1.04  AORTA Ao Root diam: 2.30 cm Ao Asc diam:  2.20 cm MITRAL VALVE MV Area (PHT): 3.89 cm    SHUNTS MV Decel Time: 195 msec    Systemic VTI:  0.18 m MV E velocity: 76.40 cm/s  Systemic Diam: 1.70 cm MV A velocity: 68.30 cm/s MV E/A ratio:  1.12 Dina Rich MD Electronically signed by Dina Rich MD Signature Date/Time: 12/22/2022/9:47:50 AM    Final    VAS [place] LOWER EXTREMITY VENOUS (DVT) (7a-7p)  Result Date: 2023/01/09  Lower Venous DVT Study Patient Name:  TYMIRE BIVINS IV  Date of Exam:   01-09-2023 Medical Rec #:  [id number]            Accession #:    [id number] Date of Birth: Nov 25, 1990             Patient Gender: M Patient Age:   49 years Exam Location:  [facility] Procedure:      VAS [place] LOWER EXTREMITY VENOUS (DVT) Referring Phys: Alvino Blood --------------------------------------------------------------------------------  Indications: Pain, Swelling, and discoloration.  Limitations: Body habitus and patient discomfort. Comparison Study: No previous exams Performing Technologist: Jody Hill RVT, RDMS  Examination Guidelines: A complete evaluation includes B-mode imaging, spectral Doppler, color Doppler, and power Doppler as needed of all accessible portions of each vessel. Bilateral testing is considered an integral part of a complete examination. Limited examinations for reoccurring indications may be performed as noted. The reflux portion of the exam is performed with the patient in reverse Trendelenburg.  +-----+---------------+---------+-----------+----------+--------------+ RIGHTCompressibilityPhasicitySpontaneityPropertiesThrombus Aging +-----+---------------+---------+-----------+----------+--------------+ CFV  Full           Yes      Yes                                 +-----+---------------+---------+-----------+----------+--------------+   +---------+---------------+---------+-----------+----------+--------------+ LEFT     CompressibilityPhasicitySpontaneityPropertiesThrombus Aging +---------+---------------+---------+-----------+----------+--------------+ CFV      None           No       No                   Acute          +---------+---------------+---------+-----------+----------+--------------+ SFJ      None                                         Acute          +---------+---------------+---------+-----------+----------+--------------+ FV Prox  None           No       Yes                  Acute          +---------+---------------+---------+-----------+----------+--------------+ FV Mid   None           No       No                    Acute          +---------+---------------+---------+-----------+----------+--------------+ FV DistalNone           No       No                   Acute          +---------+---------------+---------+-----------+----------+--------------+ PFV      None           No       No                   Acute          +---------+---------------+---------+-----------+----------+--------------+ POP      None           No       No                   Acute          +---------+---------------+---------+-----------+----------+--------------+ PTV      None  No       No                   Acute          +---------+---------------+---------+-----------+----------+--------------+ PERO     None           No       No                   Acute          +---------+---------------+---------+-----------+----------+--------------+ GSV      None           No       No                   Acute          +---------+---------------+---------+-----------+----------+--------------+ EIV      None           No       No                   Acute          +---------+---------------+---------+-----------+----------+--------------+ Attempted to look and IVC / common iliac but unable to see due to habitus and patient discomfort    Summary: RIGHT: - No evidence of common femoral vein obstruction. - No cystic structure found in the popliteal fossa.  LEFT: - Findings consistent with acute deep vein thrombosis involving the left common femoral vein, SF junction, left femoral vein, left proximal profunda vein, left popliteal vein, left posterior tibial veins, and left peroneal veins. - Findings consistent with acute superficial vein thrombosis involving the left great saphenous vein.  *See table(s) above for measurements and observations.  Suggest Peripheral Vascular Consult. Electronically signed by Coral Else MD on 01-19-23 at 8:08:43 PM.    Final    CT VENOGRAM ABDOMEN PELVIS  Result Date:  01/19/23 CLINICAL DATA:  Left lower extremity swelling and cyanosis EXAM: CT VENOGRAM ABDOMEN AND PELVIS TECHNIQUE: Venographic phase images of the abdomen and pelvis were obtained following the administration of intravenous contrast. Multiplanar reformats and maximum intensity projections were generated. RADIATION DOSE REDUCTION: This exam was performed according to the departmental dose-optimization program which includes automated exposure control, adjustment of the mA and/or kV according to patient size and/or use of iterative reconstruction technique. CONTRAST:  75mL OMNIPAQUE IOHEXOL 350 MG/ML SOLN COMPARISON:  None Available. FINDINGS: VASCULAR Arteries: No systemic arterial abnormalities are seen on this examination tailored for the evaluation of the venous structures. Bilateral filling defects are seen within the pulmonary arteries, consistent with the central and segmental pulmonary emboli seen on corresponding CT angiography of the chest. Veins: There is an acute deep venous thrombus within the left external iliac and visualized portions of the common femoral vein. Surrounding fat stranding consistent with acute thrombosis. No other deep venous thrombus is identified. IVC is patent. Portal vein, SMV, and splenic vein are widely patent. Review of the MIP images confirms the above findings. NON-VASCULAR Lower chest: Trace left pleural effusion. Hypoventilatory changes throughout the lungs. Trace pericardial effusion. Hepatobiliary: No focal liver abnormality is seen. No gallstones, gallbladder wall thickening, or biliary dilatation. Pancreas: Unremarkable. No pancreatic ductal dilatation or surrounding inflammatory changes. Spleen: Normal in size without focal abnormality. Adrenals/Urinary Tract: The kidneys are unremarkable. Excreted contrast is seen within the kidneys and ureters, with no filling defects. Bladder is minimally distended, with mass effect upon the left lateral aspect of the bladder due to  the  acute left external iliac vein thrombus and associated fat stranding. No filling defect. The adrenals are unremarkable. Stomach/Bowel: No bowel obstruction or ileus. Normal appendix right lower quadrant. No bowel wall thickening or inflammatory change. Lymphatic: No pathologic adenopathy within the abdomen or pelvis. Reproductive: Prostate is unremarkable. Other: No free intraperitoneal fluid or free intraperitoneal gas. Extraperitoneal fat stranding along the course of the left external iliac vein due to acute DVT as described above. No abdominal wall hernia. Musculoskeletal: No acute or destructive bony lesions. Reconstructed images demonstrate no additional findings. IMPRESSION: 1. Acute deep venous thrombosis of the left common femoral and left external iliac vein, with surrounding reactive fat stranding. 2. Bilateral pulmonary emboli, please see corresponding CT angiography chest report. 3. Trace left pleural effusion.  Trace pericardial effusion. Critical Value/emergent results were called by telephone at the time of interpretation on 12/21/2022 at 7:50pm to provider DR Freida Busman, who verbally acknowledged these results. Electronically Signed   By: Sharlet Salina M.D.   On: 12/21/2022 19:58   CT Angio Chest PE W/Cm &/Or Wo Cm  Result Date: 12/21/2022 CLINICAL DATA:  Pulmonary embolism (PE) suspected, high prob, left lower extremity swelling and cyanosis EXAM: CT ANGIOGRAPHY CHEST WITH CONTRAST TECHNIQUE: Multidetector CT imaging of the chest was performed using the standard protocol during bolus administration of intravenous contrast. Multiplanar CT image reconstructions and MIPs were obtained to evaluate the vascular anatomy. RADIATION DOSE REDUCTION: This exam was performed according to the departmental dose-optimization program which includes automated exposure control, adjustment of the mA and/or kV according to patient size and/or use of iterative reconstruction technique. CONTRAST:  75mL OMNIPAQUE  IOHEXOL 350 MG/ML SOLN COMPARISON:  None Available. FINDINGS: Cardiovascular: This is a technically adequate evaluation of the pulmonary vasculature. There are bilateral central and segmental pulmonary emboli, left greater than right. Severe clot burden, with evidence of right heart strain with an elevated RV/LV ratio measuring 1.42. Trace pericardial effusion. No evidence of thoracic aortic aneurysm or dissection. Mediastinum/Nodes: Mediastinal adenopathy, with right paratracheal lymph node measuring up to 10 mm reference image 23/5. Thyroid, trachea, and esophagus are unremarkable. Lungs/Pleura: Bilateral hypoventilatory changes. Trace left pleural effusion. No pneumothorax. Central airways are patent. Upper Abdomen: No acute abnormality. Musculoskeletal: No acute or destructive bony lesions. Reconstructed images demonstrate no additional findings. Review of the MIP images confirms the above findings. IMPRESSION: 1. Positive for acute PE with CT evidence of right heart strain (RV/LV Ratio = 1.42) consistent with at least submassive (intermediate risk) PE. The presence of right heart strain has been associated with an increased risk of morbidity and mortality. Please refer to the "Code PE Focused" order set in EPIC. 2. Trace pericardial effusion. 3. Trace left pleural effusion. 4. Mediastinal adenopathy, nonspecific. Critical Value/emergent results were called by telephone at the time of interpretation on 12/21/2022 at 7:50pm to provider DR Freida Busman, who verbally acknowledged these results. Electronically Signed   By: Sharlet Salina M.D.   On: 12/21/2022 19:57    Microbiology Recent Results (from the past 240 hour(s))  MRSA Next Gen by PCR, Nasal     Status: None   Collection Time: 12/22/22  1:16 AM   Specimen: Nasal Mucosa; Nasal Swab  Result Value Ref Range Status   MRSA by PCR Next Gen NOT DETECTED NOT DETECTED Final    Comment: (NOTE) The GeneXpert MRSA Assay (FDA approved for NASAL specimens only), is  one component of a comprehensive MRSA colonization surveillance program. It is not intended to diagnose MRSA infection nor to guide  or monitor treatment for MRSA infections. Test performance is not FDA approved in patients less than 47 years old. Performed at [facility], [address], [place], [place] [address]     Lab Basic Metabolic Panel: Recent Labs  Lab 01/09/23 1342 01/09/23 1414 01/09/23 1415 12/22/22 0659 12/23/22 0729 01/15/2023 0459 01/17/2023 1822 01/16/2023 1902  NA 137 138   < > 135 137 139 135 141  K 4.9 5.1   < > 4.0 4.1 4.1 5.3* 3.8  CL 99 102  --  101 103 103  --  99  CO2 23  --   --  24 26 27   --   --   GLUCOSE 150* 148*  --  137* 123* 131*  --  320*  BUN 14 15  --  12 11 9   --  8  CREATININE 1.46* 1.40*  --  1.23 0.99 0.83  --  1.00  CALCIUM 9.2  --   --  8.5* 8.4* 8.2*  --   --   MG  --   --   --  1.9 2.1 2.1  --   --   PHOS  --   --   --   --  4.7* 5.2*  --   --    < > = values in this interval not displayed.   Liver Function Tests: Recent Labs  Lab 01-09-2023 1342  AST 17  ALT 22  ALKPHOS 160*  BILITOT 1.0  PROT 8.7*  ALBUMIN 3.7   No results for input(s): "LIPASE", "AMYLASE" in the last 168 hours. No results for input(s): "AMMONIA" in the last 168 hours. CBC: Recent Labs  Lab 01/09/2023 1342 01/09/2023 1414 12/22/22 0659 12/23/22 0729 12/28/2022 0459 12/25/2022 1822 01/13/2023 1902  WBC 12.1*  --  15.0* 11.2* 10.5  --   --   NEUTROABS 8.2*  --   --   --   --   --   --   HGB 18.3*   < > 14.4 12.6* 12.2* 10.2* 7.5*  HCT 52.5*   < > 43.3 37.9* 36.7* 30.0* 22.0*  MCV 97.4  --  100.2* 99.5 100.0  --   --   PLT 243  --  225 239 240  --   --    < > = values in this interval not displayed.   Cardiac Enzymes: No results for input(s): "CKTOTAL", "CKMB", "CKMBINDEX", "TROPONINI" in the last 168 hours. Sepsis Labs: Recent Labs  Lab 01/09/2023 1342 January 09, 2023 2058 12/22/22 0013 12/22/22 0659 12/23/22 0729 12/29/2022 0459  WBC 12.1*  --   --   15.0* 11.2* 10.5  LATICACIDVEN  --  1.7 1.6  --   --   --     Procedures/Operations  Mechanical thrombectomy of  PA Left lower extremity venogram, aspiration and pharmacomechanical thrombectomy of left external iliac and common femoral veins.  Balloon angioplasty of left external iliac and common femoral veins. Left IJ central venous catheter placement for pulmonary artery catheter placement for catheter directed thrombolytics. Intubation.  Steffanie Dunn 12/25/2022, 3:20 PM

## 2023-01-23 NOTE — Sedation Documentation (Signed)
Patient HR dropped in 30s Sinus Brady. Agonal respirations, bagging patient and placed on monitor. Dr. Serafina Royals notified

## 2023-01-23 NOTE — Sedation Documentation (Signed)
Floor updated of patient disposition and status at this time with attempt to transport pt to floor with CRNA and Respiratory.

## 2023-01-23 NOTE — Progress Notes (Addendum)
  Progress Note    06-Jan-2023 8:07 AM * No surgery found *  Subjective:  somnolent   Vitals:   Jan 06, 2023 0630 2023/01/06 0700  BP:  110/64  Pulse: 73 76  Resp: 16 16  Temp:    SpO2: 92% 93%   Physical Exam: Cardiac:  regular Lungs:  non labored, 4L [place] Extremities:  left leg edematous, palpable pedal pulse Abdomen:  soft Neurologic: somnolent  CBC    Component Value Date/Time   WBC 10.5 January 06, 2023 0459   RBC 3.67 (L) Jan 06, 2023 0459   HGB 12.2 (L) 01-06-2023 0459   HCT 36.7 (L) Jan 06, 2023 0459   PLT 240 01/06/23 0459   MCV 100.0 Jan 06, 2023 0459   MCH 33.2 2023-01-06 0459   MCHC 33.2 Jan 06, 2023 0459   RDW 13.6 Jan 06, 2023 0459   LYMPHSABS 3.0 11/24/2022 1342   MONOABS 0.7 12/07/2022 1342   EOSABS 0.0 12/11/2022 1342   BASOSABS 0.1 12/16/2022 1342    BMET    Component Value Date/Time   NA 139 Jan 06, 2023 0459   K 4.1 01-06-23 0459   CL 103 01-06-2023 0459   CO2 27 2023-01-06 0459   GLUCOSE 131 (H) 2023/01/06 0459   BUN 9 January 06, 2023 0459   CREATININE 0.83 January 06, 2023 0459   CALCIUM 8.2 (L) 06-Jan-2023 0459   GFRNONAA >60 01-06-2023 0459   GFRAA >60 04/11/2020 0752    INR No results found for: "INR"   Intake/Output Summary (Last 24 hours) at 2023-01-06 0807 Last data filed at 2023-01-06 0200 Gross per 24 hour  Intake 2228.94 ml  Output 1140 ml  Net 1088.94 ml     Assessment/Plan:  32 y.o. male with extensive LLE DVT and PE with significant clot burden in [place]. Plan is ultimately to perform left lower extremity venous thrombectomy pending intervention on pulmonary artery. LLE remains very edematous. No signs of phlegmasia. Plan is for meeting today between IR, CCM and VVS to discuss timing of interventions   Marval Regal [facility] [phone] 2023-01-06 8:07 AM  I have seen and evaluated the patient. I agree with the PA note as documented above.  Seen in the ICU tolerating heparin.  Left DP is palpable with no signs of  phlegmasia.  Some pain in the thigh.  Continue IV heparin.  Nasal cannula weaned between 2-4 L [place].  Will discuss with critical care and IR next steps.  Getting repeat CTA chest this morning ordered by CCM.  Marty Heck, MD [facility] [place] Office: [phone]

## 2023-01-23 NOTE — Sedation Documentation (Signed)
Entered IR suite to assist with transfer of pt to bed from table.  Patient sats noted to be decompensating by CRNA.  Anesthesiologist and Dr. Serafina Royals notified immediately.  Code documentation followed as reflected by anesthesia documentation.

## 2023-01-23 NOTE — Sedation Documentation (Signed)
Main PA pressure  77/40 (55)

## 2023-01-23 NOTE — Anesthesia Preprocedure Evaluation (Addendum)
Anesthesia Evaluation  Patient identified by MRN, date of birth, ID band Patient awake    Reviewed: Allergy & Precautions, NPO status , Patient's Chart, lab work & pertinent test results  Airway Mallampati: II  TM Distance: >3 FB Neck ROM: Full    Dental no notable dental hx. (+) Teeth Intact, Dental Advisory Given   Pulmonary sleep apnea (on Bipap)    Pulmonary exam normal breath sounds clear to auscultation       Cardiovascular + DVT (L Leg DVT)  Normal cardiovascular exam Rhythm:Regular Rate:Normal     Neuro/Psych        ADHD Downs Syndromenegative neurological ROS     GI/Hepatic negative GI ROS, Neg liver ROS,,,  Endo/Other  negative endocrine ROS    Renal/GU Lab Results      Component                Value               Date                      CREATININE               0.83                January 18, 2023                BUN                      9                   01-18-23                NA                       139                 Jan 18, 2023                K                        4.1                 01-18-23                Musculoskeletal   Abdominal  (+) + obese (BMI 38.1)  Peds  Hematology Lab Results      Component                Value               Date                      WBC                      10.5                January 18, 2023                HGB                      12.2 (L)            18-Jan-2023                HCT  36.7 (L)            04-Jan-2023                MCV                      100.0               January 04, 2023                PLT                      240                 01-04-2023              Anesthesia Other Findings   Reproductive/Obstetrics                             Anesthesia Physical Anesthesia Plan  ASA: 3  Anesthesia Plan: MAC   Post-op Pain Management:    Induction: Intravenous  PONV Risk Score and Plan: 2 and Treatment may vary  due to age or medical condition, Ondansetron and Midazolam  Airway Management Planned: Natural Airway and Nasal Cannula  Additional Equipment: Arterial line  Intra-op Plan:   Post-operative Plan:   Informed Consent: I have reviewed the patients History and Physical, chart, labs and discussed the procedure including the risks, benefits and alternatives for the proposed anesthesia with the patient or authorized representative who has indicated his/her understanding and acceptance.     Dental advisory given  Plan Discussed with: CRNA  Anesthesia Plan Comments:        Anesthesia Quick Evaluation

## 2023-01-23 NOTE — Progress Notes (Signed)
ANTICOAGULATION CONSULT NOTE - Follow Up Consult  Pharmacy Consult for heparin Indication: PE, DVT   No Known Allergies  Patient Measurements: Height: 5\' 1"  (154.9 cm) Weight: 91.3 kg (201 lb 4.5 oz) (one blanket, two pillows) IBW/kg (Calculated) : 52.3 Heparin Dosing Weight: 72kg  Vital Signs: Temp: 98.2 F (36.8 C) 2022/12/28 0000) Temp Source: Oral 12-28-22 0000) BP: 110/64 28-Dec-2022 0700) Pulse Rate: 76 12-28-2022 0700)  Labs: Recent Labs    12/22/2022 1342 11/26/2022 1414 12/17/2022 2113 12/22/22 0013 12/22/22 0421 12/22/22 0659 12/22/22 1435 12/23/22 0729 2022-12-28 0459  HGB 18.3*   < >  --   --   --  14.4  --  12.6* 12.2*  HCT 52.5*   < >  --   --   --  43.3  --  37.9* 36.7*  PLT 243  --   --   --   --  225  --  239 240  HEPARINUNFRC  --   --  0.19*  --    < >  --  0.29* 0.35 0.27*  CREATININE 1.46*   < >  --   --   --  1.23  --  0.99 0.83  TROPONINIHS 40*  --  25* 29*  --   --   --   --   --    < > = values in this interval not displayed.     Estimated Creatinine Clearance: 125 mL/min (by C-G formula based on SCr of 0.83 mg/dL).   Medical History: Past Medical History:  Diagnosis Date   ADHD (attention deficit hyperactivity disorder)    Down's syndrome     Assessment: 14 YOM presenting with foot pain, no pulses with doppler, he is not on anticoagulation PTA. Found to have extensive DVT and PE large clot burden w/ RH strain. Pt started on IV heparin per pharmacy.  Heparin level slightly below goal at 0.27, CBC stable. Possible mechanical thrombectomy today. No bleeding issues.   Goal of Therapy:  Heparin level 0.3-0.7 units/ml Monitor platelets by anticoagulation protocol: Yes   Plan:  Increase heparin drip to 1900 units/h Recheck heparin level with daily labs  Arrie Senate, PharmD, BCPS, [facility] Clinical Pharmacist [phone] Please check AMION for all [facility] Pharmacy numbers Dec 28, 2022

## 2023-01-23 NOTE — Anesthesia Procedure Notes (Signed)
Procedure Name: MAC Date/Time: January 13, 2023 1:45 PM  Performed by: Darletta Moll, CRNAPre-anesthesia Checklist: Patient identified, Emergency Drugs available, Suction available and Patient being monitored Patient Re-evaluated:Patient Re-evaluated prior to induction Oxygen Delivery Method: Simple face mask

## 2023-01-23 NOTE — Sedation Documentation (Signed)
Left Ted hose in place per order

## 2023-01-23 NOTE — Procedures (Signed)
Interventional Radiology Procedure Note  Procedure:  1) Left IJ central line placement 2) Initiation of bilateral pulmonary artery thrombolysis   Findings: Please refer to procedural dictation for full description. After prior procedures, the patient experienced profound bradycardia and hypotension.  He was intubated by Anesthesia and placed on pressors.  Given presumed etiology of additional PE from lower extremity intervention, decision was made to place thrombolysis catheters.  Left IJ central line placed at request of Anesthesia.  Complications: None immediate  Estimated Blood Loss: < 5 ml  Recommendations: 1 mg/hr t-PA via each catheter for 12 hour total infusion. Q6H fibrinogen and CBC. IR will follow, plan for catheter removal in am if clinically improved.   Ruthann Cancer, MD

## 2023-01-23 DEATH — deceased
